# Patient Record
Sex: Female | Born: 1965 | Race: White | Hispanic: No | Smoking: Former smoker
Health system: Southern US, Community
[De-identification: ages and names within clinical notes are randomized; demographics above are authoritative.]

## PROBLEM LIST (undated history)

## (undated) DIAGNOSIS — J189 Pneumonia, unspecified organism: Secondary | ICD-10-CM

## (undated) DIAGNOSIS — F329 Major depressive disorder, single episode, unspecified: Secondary | ICD-10-CM

## (undated) DIAGNOSIS — G4730 Sleep apnea, unspecified: Secondary | ICD-10-CM

## (undated) DIAGNOSIS — R51 Headache: Secondary | ICD-10-CM

## (undated) DIAGNOSIS — M4800 Spinal stenosis, site unspecified: Secondary | ICD-10-CM

## (undated) DIAGNOSIS — M549 Dorsalgia, unspecified: Secondary | ICD-10-CM

## (undated) DIAGNOSIS — M1990 Unspecified osteoarthritis, unspecified site: Secondary | ICD-10-CM

## (undated) DIAGNOSIS — M5136 Other intervertebral disc degeneration, lumbar region: Secondary | ICD-10-CM

## (undated) DIAGNOSIS — R569 Unspecified convulsions: Secondary | ICD-10-CM

## (undated) DIAGNOSIS — F419 Anxiety disorder, unspecified: Secondary | ICD-10-CM

## (undated) DIAGNOSIS — R519 Headache, unspecified: Secondary | ICD-10-CM

## (undated) DIAGNOSIS — F32A Depression, unspecified: Secondary | ICD-10-CM

## (undated) HISTORY — PX: SHOULDER SURGERY: SHX246

## (undated) HISTORY — PX: COLONOSCOPY: SHX174

## (undated) HISTORY — PX: ANTERIOR CERVICAL DECOMP/DISCECTOMY FUSION: SHX1161

## (undated) HISTORY — PX: ABDOMINAL HYSTERECTOMY: SHX81

## (undated) HISTORY — PX: HERNIA REPAIR: SHX51

---

## 2014-12-12 ENCOUNTER — Emergency Department (HOSPITAL_COMMUNITY): Payer: Medicare Other

## 2014-12-12 ENCOUNTER — Encounter (HOSPITAL_COMMUNITY): Payer: Self-pay | Admitting: Nurse Practitioner

## 2014-12-12 ENCOUNTER — Emergency Department (HOSPITAL_COMMUNITY)
Admission: EM | Admit: 2014-12-12 | Discharge: 2014-12-12 | Disposition: A | Discharge status: Home / Self Care | Payer: Medicare Other | Attending: Emergency Medicine | Admitting: Emergency Medicine

## 2014-12-12 DIAGNOSIS — S8992XA Unspecified injury of left lower leg, initial encounter: Secondary | ICD-10-CM | POA: Diagnosis present

## 2014-12-12 DIAGNOSIS — Y9289 Other specified places as the place of occurrence of the external cause: Secondary | ICD-10-CM | POA: Diagnosis not present

## 2014-12-12 DIAGNOSIS — Y998 Other external cause status: Secondary | ICD-10-CM | POA: Diagnosis not present

## 2014-12-12 DIAGNOSIS — S8255XA Nondisplaced fracture of medial malleolus of left tibia, initial encounter for closed fracture: Secondary | ICD-10-CM | POA: Insufficient documentation

## 2014-12-12 DIAGNOSIS — X58XXXA Exposure to other specified factors, initial encounter: Secondary | ICD-10-CM | POA: Diagnosis not present

## 2014-12-12 DIAGNOSIS — Z8739 Personal history of other diseases of the musculoskeletal system and connective tissue: Secondary | ICD-10-CM | POA: Insufficient documentation

## 2014-12-12 DIAGNOSIS — S82202A Unspecified fracture of shaft of left tibia, initial encounter for closed fracture: Secondary | ICD-10-CM

## 2014-12-12 DIAGNOSIS — Y9389 Activity, other specified: Secondary | ICD-10-CM | POA: Diagnosis not present

## 2014-12-12 HISTORY — DX: Spinal stenosis, site unspecified: M48.00

## 2014-12-12 HISTORY — DX: Dorsalgia, unspecified: M54.9

## 2014-12-12 HISTORY — DX: Other intervertebral disc degeneration, lumbar region: M51.36

## 2014-12-12 HISTORY — DX: Unspecified osteoarthritis, unspecified site: M19.90

## 2014-12-12 MED ORDER — OXYCODONE-ACETAMINOPHEN 5-325 MG PO TABS: 1.0000 | ORAL_TABLET | Freq: Four times a day (QID) | ORAL | Status: DC | PRN

## 2014-12-12 MED ORDER — OXYCODONE-ACETAMINOPHEN 5-325 MG PO TABS
1.0000 | ORAL_TABLET | Freq: Once | ORAL | Status: AC
  Administered 2014-12-12: 1 via ORAL
  Filled 2014-12-12: qty 1
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
## 2014-12-12 NOTE — ED Notes (Signed)
She reports hx herniated disk with chronic lower back and L leg pain. She woke about 3 am to use the restroom and her L leg was painful and numb and she felt a pop in her L ankle and her pain has been worse since. She states she can not bear weight on the L ankle now

## 2014-12-12 NOTE — Discharge Instructions (Signed)
Ankle Fracture with Rehab Two bones in the ankle, the shinbone (tibia) and the bone of the outer ankle and lower leg (fibula), are susceptible to being fractured. The fracture may be a complete or an incomplete break of the bone. It is also common for the ligaments of the ankle joint to be injured at the same time as a fracture. SYMPTOMS   Severe pain in the ankle at the time of injury and/or when trying to move the ankle.  Feeling of popping or tearing in the inner or outer part of the ankle, sometimes as if the ankle joint was temporarily dislocated and popped back into place.  Cracking or other sounds may be heard at the time of fracture.  Severe tenderness in the ankle.  Swelling in the ankle and foot  Blisters around the ankle (uncommon).  Bleeding and bruising (contusion) in the ankle and foot.  Inability to stand or bear weight on the injured foot.  Visible deformity if the fracture is complete and the bone fragments separate enough to distort normal leg contours.  Numbness and coldness in the foot if the blood supply is impaired. CAUSES  Bones break when subjected to a force that is greater than the their strength. Most fractures are due to direct trauma, such as being hit with an object or falling.  Fractured may also be caused by indirect stress, such as twisting, pivoting, or violent muscle contraction . RISK INCREASES WITH:  Sports that require quick changes in direction (football, soccer, or skiing).  Sports that require jumping (basketball, volleyball, distance jumping, or high jumping).  Walking or running on uneven or rough surfaces.  Shoes with inadequate support to prevent the foot and ankle from rolling over when stress occurs.  Bony abnormalities (osteoporosis or bone tumors).  Metabolic disorders, hormone problems, and nutritional deficiencies and disorders.  Poor strength and flexibility  Previous ankle injury. PREVENTION   Warm up and stretch  properly before activity.  Maintain physical fitness:  Leg and ankle strength.  Flexibility and endurance.  Cardiovascular fitness.  Wear properly fitted protective equipment (high-top shoes or when appropriate and ankle bracing, taping, or splinting), especially for the first 12 months after an ankle injury. PROGNOSIS  If treated properly, ankle fractures typically heal well. RELATED COMPLICATIONS   Failure to heal (nonunion).  Healing in poor position (malunion).  Arrest of normal bone growth in children.  Proneness to repeated ankle injury.  Stiff ankle.  Unstable or arthritic ankle.  Infected skin blisters.  Prolonged healing time if activity is resumed too quickly. Risks of surgery, including infection, bleeding, injury to nerves (numbness, weakness, paralysis), and need for further surgery. TREATMENT  Treatment initially consists of ice and medication to help reduce pain and inflammation. The joint must be immobilized to allow for healing. If the fracture is where the bones are out of alignment (displaced), then surgery may be necessary to realign (reduce) them. Surgery usually involves placing pins and screws in the bones to hold them in place while the fracture heals. After surgery the joint is immobilized. Bone growth stimulators may be used to promote bone growth, but this is uncommon, Strengthening and stretching exercises are usually necessary after immobilization in order to regain strength and a full range of motion. These exercises may be completed at home or with a therapist. If pins and screws are placed in the bone, they are not usually removed unless they become a source of pain. MEDICATION   If pain medication is necessary,   then nonsteroidal anti-inflammatory medications, such as aspirin and ibuprofen, or other minor pain relievers, such as acetaminophen, are often recommended.  Do not take pain medication within 7 days before surgery.  Prescription pain  relievers may be prescribed if deemed necessary by your caregiver. Use only as directed and only as much as you need. SEEK MEDICAL CARE IF:   Symptoms get worse or do not improve in 2 weeks despite treatment.  The following occur after immobilization or surgery:  Swelling above or below the fracture site.  Severe, persistent pain.  Blue or gray skin below the fracture site, especially under the nails, or numbness or loss of feeling below the fracture site. Report any of these signs immediately.  New, unexplained symptoms develop (drugs used in treatment may produce side effects). EXERCISES  RANGE OF MOTION (ROM) AND STRETCHING EXERCISES - Ankle Fracture These exercises may help you when beginning to rehabilitate your injury. Your symptoms may resolve with or without further involvement from your physician, physical therapist or athletic trainer. While completing these exercises, remember:   Restoring tissue flexibility helps normal motion to return to the joints. This allows healthier, less painful movement and activity.  An effective stretch should be held for at least 30 seconds.  A stretch should never be painful. You should only feel a gentle lengthening or release in the stretched tissue. RANGE OF MOTION - Dorsi/Plantar Flexion  While sitting with your right / left knee straight, draw the top of your foot upwards by flexing your ankle. Then reverse the motion, pointing your toes downward.  Hold each position for __________ seconds.  After completing your first set of exercises, repeat this exercise with your knee bent. Repeat __________ times. Complete this exercise __________ times per day.  RANGE OF MOTION- Ankle Plantar Flexion   Sit with your right / left leg crossed over your opposite knee.  Use your opposite hand to pull the top of your foot and toes toward you.  You should feel a gentle stretch on the top of your foot/ankle. Hold this position for __________  seconds. Repeat __________ times. Complete __________ times per day.  RANGE OF MOTION - Ankle Eversion  Sit with your right / left ankle crossed over your opposite knee.  Grip your foot with your opposite hand, placing your thumb on the top of your foot and your fingers across the bottom of your foot.  Gently push your foot downward with a slight rotation so your littlest toes rise slightly  You should feel a gentle stretch on the inside of your ankle. Hold the stretch for __________ seconds. Repeat __________ times. Complete this exercise __________ times per day.  RANGE OF MOTION - Ankle Inversion  Sit with your right / left ankle crossed over your opposite knee.  Grip your foot with your opposite hand, placing your thumb on the bottom of your foot and your fingers across the top of your foot.  Gently pull your foot so the smallest toe comes toward you and your thumb pushes the inside of the ball of your foot away from you.  You should feel a gentle stretch on the outside of your ankle. Hold the stretch for __________ seconds. Repeat __________ times. Complete this exercise __________ times per day.  RANGE OF MOTION - Ankle Alphabet  Imagine your right / left big toe is a pen.  Keeping your hip and knee still, write out the entire alphabet with your "pen." Make the letters as large as you can   without increasing any discomfort. Repeat __________ times. Complete this exercise __________ times per day.  RANGE OF MOTION - Ankle Dorsiflexion, Active Assisted   Remove shoes and sit on a chair that is preferably not on a carpeted surface.  Place right / left foot under knee. Extend your opposite leg for support.  Keeping your heel down, slide your right / left foot back toward the chair until you feel a stretch at your ankle or calf. If you do not feel a stretch, slide your bottom forward to the edge of the chair, while still keeping your heel down.  Hold this stretch for __________  seconds. Repeat __________ times. Complete this stretch __________ times per day.  STRETCH - Gastrocsoleus   Sit with your right / left leg extended. Holding onto both ends of a belt or towel, loop it around the ball of your foot.  Keeping your right / left ankle and foot relaxed and your knee straight, pull your foot and ankle toward you using the belt/towel.  You should feel a gentle stretch behind your calf or knee. Hold this position for __________ seconds. Repeat __________ times. Complete this stretch __________ times per day.  STRENGTHENING EXERCISES - Ankle Fracture These exercises may help you when beginning to rehabilitate your injury. They may resolve your symptoms with or without further involvement from your physician, physical therapist or athletic trainer. While completing these exercises, remember:   Muscles can gain both the endurance and the strength needed for everyday activities through controlled exercises.  Complete these exercises as instructed by your physician, physical therapist or athletic trainer. Progress the resistance and repetitions only as guided.  You may experience muscle soreness or fatigue, but the pain or discomfort you are trying to eliminate should never worsen during these exercises. If this pain does worsen, stop and make certain you are following the directions exactly. If the pain is still present after adjustments, discontinue the exercise until you can discuss the trouble with your clinician. STRENGTH - Dorsiflexors  Secure a rubber exercise band/tubing to a fixed object (table, pole) and loop the other end around your right / left foot.  Sit on the floor facing the fixed object. The band/tubing should be slightly tense when your foot is relaxed.  Slowly draw your foot back toward you using your ankle and toes.  Hold this position for __________ seconds. Slowly release the tension in the band and return your foot to the starting  position. Repeat __________ times. Complete this exercise __________ times per day.  STRENGTH - Plantar-flexors  Sit with your right / left leg extended. Holding onto both ends of a rubber exercise band/tubing, loop it around the ball of your foot. Keep a slight tension in the band.  Slowly push your toes away from you, pointing them downward.  Hold this position for __________ seconds. Return slowly, controlling the tension in the band/tubing. Repeat __________ times. Complete this exercise __________ times per day.  STRENGTH - Ankle Eversion  Secure one end of a rubber exercise band/tubing to a fixed object (table, pole). Loop the other end around your foot just before your toes.  Place your fists between your knees. This will focus your strengthening at your ankle.  Drawing the band/tubing across your opposite foot, slowly, pull your little toe out and up. Make sure the band/tubing is positioned to resist the entire motion.  Hold this position for __________ seconds.  Have your muscles resist the band/tubing as it slowly pulls your foot   back to the starting position. Repeat __________ times. Complete this exercise __________ times per day.  STRENGTH - Ankle Inversion  Secure one end of a rubber exercise band/tubing to a fixed object (table, pole). Loop the other end around your foot just before your toes.  Place your fists between your knees. This will focus your strengthening at your ankle.  Slowly, pull your big toe up and in, making sure the band/tubing is positioned to resist the entire motion.  Hold this position for __________ seconds.  Have your muscles resist the band/tubing as it slowly pulls your foot back to the starting position. Repeat __________ times. Complete this exercises __________ times per day.  STRENGTH - Towel Curls  Sit in a chair positioned on a non-carpeted surface.  Place your foot on a towel, keeping your heel on the floor.  Pull the towel toward  your heel by only curling your toes. Keep your heel on the floor.  If instructed by your physician, physical therapist or athletic trainer, add weight at the end of the towel. Repeat __________ times. Complete this exercise __________ times per day. STRENGTH - Plantar-flexors, Standing   Stand with your feet shoulder width apart. Steady yourself with a wall or table using as little support as needed.  Keeping your weight evenly spread over the width of your feet, rise up on your toes.*  Hold this position for __________ seconds. Repeat __________ times. Complete this exercise __________ times per day.  *If this is too easy, shift your weight toward your right / left leg until you feel challenged. Ultimately, you may be asked to do this exercise with your right / left foot only. Document Released: 06/14/2005 Document Revised: 02/05/2012 Document Reviewed: 02/25/2009 [employer] Patient Information 2015 [employer]. This information is not intended to replace advice given to you by your health care provider. Make sure you discuss any questions you have with your health care provider.  

## 2018-04-07 ENCOUNTER — Emergency Department (HOSPITAL_COMMUNITY): Payer: Medicare Other

## 2018-04-07 ENCOUNTER — Encounter (HOSPITAL_COMMUNITY): Payer: Self-pay | Admitting: Emergency Medicine

## 2018-04-07 ENCOUNTER — Other Ambulatory Visit: Payer: Self-pay

## 2018-04-07 DIAGNOSIS — J020 Streptococcal pharyngitis: Secondary | ICD-10-CM | POA: Diagnosis not present

## 2018-04-07 DIAGNOSIS — J029 Acute pharyngitis, unspecified: Secondary | ICD-10-CM | POA: Diagnosis present

## 2018-04-07 DIAGNOSIS — Z79899 Other long term (current) drug therapy: Secondary | ICD-10-CM | POA: Diagnosis not present

## 2018-04-07 LAB — GROUP A STREP BY PCR: Group A Strep by PCR: DETECTED — AB

## 2018-04-07 MED ORDER — ALBUTEROL SULFATE (2.5 MG/3ML) 0.083% IN NEBU
5.0000 mg | INHALATION_SOLUTION | Freq: Once | RESPIRATORY_TRACT | Status: AC
  Administered 2018-04-07: 5 mg via RESPIRATORY_TRACT
  Filled 2018-04-07: qty 6

## 2018-04-07 NOTE — ED Triage Notes (Signed)
Pt reports having sore throat and coughing up yellow sputum. Pt reporting body aches and not being able to eat but has been able to drink fluids.

## 2018-04-08 ENCOUNTER — Emergency Department (HOSPITAL_COMMUNITY)
Admission: EM | Admit: 2018-04-08 | Discharge: 2018-04-08 | Disposition: A | Discharge status: Home / Self Care | Payer: Medicare Other | Attending: Emergency Medicine | Admitting: Emergency Medicine

## 2018-04-08 DIAGNOSIS — J020 Streptococcal pharyngitis: Secondary | ICD-10-CM | POA: Diagnosis not present

## 2018-04-08 HISTORY — DX: Unspecified convulsions: R56.9

## 2018-04-08 MED ORDER — PREDNISONE 20 MG PO TABS
60.0000 mg | ORAL_TABLET | Freq: Once | ORAL | Status: AC
  Administered 2018-04-08: 60 mg via ORAL
  Filled 2018-04-08: qty 3

## 2018-04-08 MED ORDER — PENICILLIN G BENZATHINE 1200000 UNIT/2ML IM SUSP
1.2000 10*6.[IU] | Freq: Once | INTRAMUSCULAR | Status: AC
  Administered 2018-04-08: 1.2 10*6.[IU] via INTRAMUSCULAR
  Filled 2018-04-08: qty 2

## 2018-04-08 NOTE — ED Provider Notes (Signed)
[facility]-EMERGENCY DEPT Provider Note   CSN: [id number] Arrival date & time: 04/07/18  2014     History   Chief Complaint Chief Complaint  Patient presents with  . Sore Throat  . Cough    HPI Autumn Estrada is a 52 y.o. female w PMHx seizure d/o, presenting to the ED with 7 days of sore throat. She states she lives her daughter and son-in-law, who both had strep throat this week.  He states pain is worse with swallowing.  Has had difficulty eating solids, however has been drinking lots of fluids.  Reports associated nasal congestion, ear pain, and generalized myalgias.  States about 3 days ago she developed a cough that is productive of yellow sputum.  Denies fever, difficulty breathing, or other complaints.  The history is provided by the patient.    Past Medical History:  Diagnosis Date  . Arthritis   . Back pain   . Degenerative disc disease, lumbar   . Seizures (HCC)   . Spinal stenosis     There are no active problems to display for this patient.   History reviewed. No pertinent surgical history.   OB History   None      Home Medications    Prior to Admission medications   Medication Sig Start Date End Date Taking? Authorizing Provider  oxyCODONE-acetaminophen (PERCOCET/ROXICET) 5-325 MG per tablet Take 1 tablet by mouth every 6 (six) hours as needed for severe pain. 12/12/14   Fayrene Helper, PA-C    Family History History reviewed. No pertinent family history.  Social History Social History   Tobacco Use  . Smoking status: Never Smoker  . Smokeless tobacco: Never Used  Substance Use Topics  . Alcohol use: No  . Drug use: No     Allergies   Patient has no known allergies.   Review of Systems Review of Systems  Constitutional: Negative for fever.  HENT: Positive for congestion, ear pain and sore throat. Negative for ear discharge, trouble swallowing and voice change.   Respiratory: Positive for cough. Negative for  shortness of breath.      Physical Exam Updated Vital Signs BP 110/60 (BP Location: Right Arm)   Pulse 83   Temp 98.7 F (37.1 C) (Oral)   Resp 16   Ht  (1.702 m)   Wt 71.5 kg (157 lb 9.6 oz)   SpO2 95%   BMI 24.68 kg/m   Physical Exam  Constitutional: She appears well-developed and well-nourished. No distress.  Tolerating secretions.  HENT:  Head: Normocephalic and atraumatic.  Right Ear: Tympanic membrane and ear canal normal.  Left Ear: Tympanic membrane and ear canal normal.  Mouth/Throat: Uvula is midline. No trismus in the jaw. No uvula swelling. Posterior oropharyngeal edema (mild) and posterior oropharyngeal erythema present. No tonsillar abscesses. No tonsillar exudate.  Nose is congested  Eyes: Conjunctivae are normal.  Neck: Normal range of motion. Neck supple.  Cardiovascular: Normal rate, regular rhythm and intact distal pulses.  Pulmonary/Chest: Effort normal and breath sounds normal. No stridor. No respiratory distress. She has no wheezes. She has no rales.  Lymphadenopathy:    She has cervical adenopathy (b/l anterior).  Psychiatric: She has a normal mood and affect. Her behavior is normal.  Nursing note and vitals reviewed.    ED Treatments / Results  Labs (all labs ordered are listed, but only abnormal results are displayed) Labs Reviewed  GROUP A STREP BY PCR - Abnormal; Notable for the following components:  Result Value   Group A Strep by PCR DETECTED (*)    All other components within normal limits    EKG EKG Interpretation  Date/Time:  Sunday Apr 07 2018 21:41:10 EDT Ventricular Rate:  78 PR Interval:    QRS Duration: 78 QT Interval:  403 QTC Calculation: 459 R Axis:   88 Text Interpretation:  Sinus rhythm Consider left atrial enlargement Baseline wander Otherwise within normal limits No old tracing to compare Confirmed by Devoria Albe ([id number]) on 04/08/2018 2:11:49 AM   Radiology Dg Chest 2 View  Result Date:  04/07/2018 CLINICAL DATA:  Acute onset of sore throat and productive cough. Generalized body aches. EXAM: CHEST - 2 VIEW COMPARISON:  None. FINDINGS: The lungs are well-aerated and clear. There is no evidence of focal opacification, pleural effusion or pneumothorax. The heart is normal in size; the mediastinal contour is within normal limits. No acute osseous abnormalities are seen. Cervical spinal fusion hardware is noted. The patient is status post left-sided rotator cuff repair. IMPRESSION: No acute cardiopulmonary process seen. Electronically Signed   By: Roanna Raider M.D.   On: 04/07/2018 22:43    Procedures Procedures (including critical care time)  Medications Ordered in ED Medications  albuterol (PROVENTIL) (2.5 MG/3ML) 0.083% nebulizer solution 5 mg (5 mg Nebulization Given 04/07/18 2228)  penicillin g benzathine (BICILLIN LA) 1200000 UNIT/2ML injection 1.2 Million Units (1.2 Million Units Intramuscular Given 04/08/18 0238)  predniSONE (DELTASONE) tablet 60 mg (60 mg Oral Given 04/08/18 0238)     Initial Impression / Assessment and Plan / ED Course  I have reviewed the triage vital signs and the nursing notes.  Pertinent labs & imaging results that were available during my care of the patient were reviewed by me and considered in my medical decision making (see chart for details).     Pt w sore throat x 1 week, diagnosis of strep pharyngitis. Afebrile, tolerating secretions, VSS. Lungs CTAB. CXR done in triage is neg. Positive strep PCR. Pt treated with steroids and PCN IM.  Discussed importance of water rehydration. Presentation non concerning for PTA or infxn spread to soft tissue. No trismus or uvula deviation. Specific return precautions discussed. Pt able to drink water in ED without difficulty with intact air way. Recommended PCP follow up.   Discussed results, findings, treatment and follow up. Patient advised of return precautions. Patient verbalized understanding and agreed  with plan.  Final Clinical Impressions(s) / ED Diagnoses   Final diagnoses:  Strep pharyngitis    ED Discharge Orders    None       Robinson, [place] N, PA-C 04/08/18 0247    Devoria Albe, MD 04/08/18 [phone]

## 2018-04-08 NOTE — Discharge Instructions (Addendum)
Please read instructions below.  You have been treated with penicillin for strep throat. You can take tylenol or ibuprofen as needed for sore throat or fever.  Drink plenty of water.  Use saline nasal spray for congestion. Follow up with your primary care provider or urgent care if symptoms don't improve. Return to the ER for inability to swallow liquids, difficulty breathing, or new or worsening symptoms.

## 2019-01-08 NOTE — H&P (Signed)
HISTORY AND PHYSICAL  Autumn Estrada is a 53 y.o. female patient with CC: painful teeth. Referred by general dentist for full mouth extractions.  No diagnosis found.  Past Medical History:  Diagnosis Date  . Arthritis   . Back pain   . Degenerative disc disease, lumbar   . Seizures (HCC)   . Spinal stenosis     No current facility-administered medications for this encounter.    Current Outpatient Medications  Medication Sig Dispense Refill  . ALPRAZolam (XANAX) 1 MG tablet Take 1.5 mg by mouth 3 (three) times daily.    . Aspirin-Acetaminophen-Caffeine (GOODY HEADACHE PO) Take 1-2 packets by mouth 2 (two) times daily as needed (migraines).    . carisoprodol (SOMA) 350 MG tablet Take 350 mg by mouth 3 (three) times daily.    Marland Kitchen gabapentin (NEURONTIN) 600 MG tablet Take 1,200 mg by mouth 3 (three) times daily.    Marland Kitchen lamoTRIgine (LAMICTAL) 200 MG tablet Take 200 mg by mouth 2 (two) times daily.    Marland Kitchen levocetirizine (XYZAL) 5 MG tablet Take 5 mg by mouth at bedtime.    . ondansetron (ZOFRAN-ODT) 4 MG disintegrating tablet Take 4 mg by mouth 3 (three) times daily.    . sertraline (ZOLOFT) 100 MG tablet Take 200 mg by mouth daily.    . traZODone (DESYREL) 100 MG tablet Take 200 mg by mouth at bedtime.     Allergies  Allergen Reactions  . Pregabalin Other (See Comments)    agitation   Active Problems:   * No active hospital problems. *  Vitals: There were no vitals taken for this visit. Lab results:No results found for this or any previous visit (from the past 24 hour(s)). Radiology Results: No results found. General appearance: alert, cooperative and no distress Head: Normocephalic, without obvious abnormality, atraumatic Eyes: negative Nose: Nares normal. Septum midline. Mucosa normal. No drainage or sinus tenderness. Throat: multiple dental caries, root exposure, periodontitis. Bilateral mandibular lingual tori. Pharynx clear. Neck: no adenopathy, supple, symmetrical, trachea  midline and thyroid not enlarged, symmetric, no tenderness/mass/nodules Resp: clear to auscultation bilaterally Cardio: regular rate and rhythm, S1, S2 normal, no murmur, click, rub or gallop  Assessment: All teeth non-restorable secondary to dental caries, periodontitis. Bilateral mandibular lingual tori.  Plan:Full mouth extractions with alveoloplasty, tori removal GA. 23h obs.   Ocie Doyne 01/08/2019

## 2019-01-09 ENCOUNTER — Other Ambulatory Visit: Payer: Self-pay

## 2019-01-09 ENCOUNTER — Encounter (HOSPITAL_COMMUNITY): Payer: Self-pay | Admitting: *Deleted

## 2019-01-09 NOTE — Anesthesia Preprocedure Evaluation (Addendum)
Anesthesia Evaluation  Patient identified by MRN, date of birth, ID band Patient awake    Reviewed: Allergy & Precautions, NPO status , Patient's Chart, lab work & pertinent test results  History of Anesthesia Complications Negative for: history of anesthetic complications  Airway Mallampati: II  TM Distance: >3 FB Neck ROM: Full    Dental  (+) Dental Advisory Given, Poor Dentition   Pulmonary sleep apnea , former smoker,    Pulmonary exam normal breath sounds clear to auscultation       Cardiovascular negative cardio ROS Normal cardiovascular exam Rhythm:Regular Rate:Normal     Neuro/Psych Seizures - (last seizure approx 4 months ago), Well Controlled,  Anxiety Depression    GI/Hepatic negative GI ROS, Neg liver ROS,   Endo/Other  negative endocrine ROS  Renal/GU negative Renal ROS     Musculoskeletal  (+) Arthritis ,   Abdominal   Peds  Hematology negative hematology ROS (+)   Anesthesia Other Findings Day of surgery medications reviewed with the patient.  Reproductive/Obstetrics                            Anesthesia Physical Anesthesia Plan  ASA: II  Anesthesia Plan: General   Post-op Pain Management:    Induction: Intravenous  PONV Risk Score and Plan: 3 and Treatment may vary due to age or medical condition, Ondansetron, Dexamethasone and Midazolam  Airway Management Planned: Nasal ETT  Additional Equipment:   Intra-op Plan:   Post-operative Plan: Extubation in OR  Informed Consent: I have reviewed the patients History and Physical, chart, labs and discussed the procedure including the risks, benefits and alternatives for the proposed anesthesia with the patient or authorized representative who has indicated his/her understanding and acceptance.     Dental advisory given  Plan Discussed with: CRNA  Anesthesia Plan Comments:        Anesthesia Quick  Evaluation

## 2019-01-09 NOTE — Progress Notes (Signed)
Spoke with pt for pre-op call. Pt denies cardiac history, HTN or Diabetes. Pt does have hx of seizures, none for 3 months.

## 2019-01-10 ENCOUNTER — Observation Stay (HOSPITAL_COMMUNITY)
Admission: RE | Admit: 2019-01-10 | Discharge: 2019-01-11 | Disposition: A | Discharge status: Home / Self Care | Payer: Medicare Other | Attending: Oral Surgery | Admitting: Oral Surgery

## 2019-01-10 ENCOUNTER — Ambulatory Visit (HOSPITAL_COMMUNITY): Payer: Medicare Other | Admitting: Certified Registered Nurse Anesthetist

## 2019-01-10 ENCOUNTER — Encounter (HOSPITAL_COMMUNITY): Payer: Self-pay

## 2019-01-10 ENCOUNTER — Encounter (HOSPITAL_COMMUNITY)
Admission: RE | Disposition: A | Discharge status: Home / Self Care | Payer: Self-pay | Source: Home / Self Care | Attending: Oral Surgery

## 2019-01-10 ENCOUNTER — Other Ambulatory Visit: Payer: Self-pay

## 2019-01-10 DIAGNOSIS — R569 Unspecified convulsions: Secondary | ICD-10-CM | POA: Diagnosis not present

## 2019-01-10 DIAGNOSIS — K029 Dental caries, unspecified: Principal | ICD-10-CM | POA: Insufficient documentation

## 2019-01-10 DIAGNOSIS — Z87891 Personal history of nicotine dependence: Secondary | ICD-10-CM | POA: Diagnosis not present

## 2019-01-10 DIAGNOSIS — Z79899 Other long term (current) drug therapy: Secondary | ICD-10-CM | POA: Diagnosis not present

## 2019-01-10 DIAGNOSIS — Z888 Allergy status to other drugs, medicaments and biological substances status: Secondary | ICD-10-CM | POA: Insufficient documentation

## 2019-01-10 DIAGNOSIS — Z9889 Other specified postprocedural states: Secondary | ICD-10-CM

## 2019-01-10 DIAGNOSIS — G4730 Sleep apnea, unspecified: Secondary | ICD-10-CM | POA: Insufficient documentation

## 2019-01-10 DIAGNOSIS — K0530 Chronic periodontitis, unspecified: Secondary | ICD-10-CM | POA: Diagnosis not present

## 2019-01-10 DIAGNOSIS — M270 Developmental disorders of jaws: Secondary | ICD-10-CM | POA: Diagnosis not present

## 2019-01-10 DIAGNOSIS — Z7982 Long term (current) use of aspirin: Secondary | ICD-10-CM | POA: Insufficient documentation

## 2019-01-10 DIAGNOSIS — F419 Anxiety disorder, unspecified: Secondary | ICD-10-CM | POA: Diagnosis not present

## 2019-01-10 DIAGNOSIS — F329 Major depressive disorder, single episode, unspecified: Secondary | ICD-10-CM | POA: Diagnosis not present

## 2019-01-10 DIAGNOSIS — Z791 Long term (current) use of non-steroidal anti-inflammatories (NSAID): Secondary | ICD-10-CM | POA: Insufficient documentation

## 2019-01-10 DIAGNOSIS — M5136 Other intervertebral disc degeneration, lumbar region: Secondary | ICD-10-CM | POA: Diagnosis not present

## 2019-01-10 DIAGNOSIS — M1990 Unspecified osteoarthritis, unspecified site: Secondary | ICD-10-CM | POA: Insufficient documentation

## 2019-01-10 HISTORY — DX: Pneumonia, unspecified organism: J18.9

## 2019-01-10 HISTORY — DX: Sleep apnea, unspecified: G47.30

## 2019-01-10 HISTORY — DX: Anxiety disorder, unspecified: F41.9

## 2019-01-10 HISTORY — DX: Depression, unspecified: F32.A

## 2019-01-10 HISTORY — DX: Major depressive disorder, single episode, unspecified: F32.9

## 2019-01-10 HISTORY — PX: TOOTH EXTRACTION: SHX859

## 2019-01-10 HISTORY — DX: Headache, unspecified: R51.9

## 2019-01-10 HISTORY — DX: Headache: R51

## 2019-01-10 LAB — BASIC METABOLIC PANEL
ANION GAP: 9 (ref 5–15)
BUN: 18 mg/dL (ref 6–20)
CO2: 25 mmol/L (ref 22–32)
Calcium: 9 mg/dL (ref 8.9–10.3)
Chloride: 106 mmol/L (ref 98–111)
Creatinine, Ser: 0.75 mg/dL (ref 0.44–1.00)
GFR calc Af Amer: >60 mL/min (ref >60)
GFR calc non Af Amer: >60 mL/min (ref >60)
Glucose, Bld: 96 mg/dL (ref 70–99)
Potassium: 4.2 mmol/L (ref 3.5–5.1)
Sodium: 140 mmol/L (ref 135–145)

## 2019-01-10 LAB — CBC
HCT: 37.6 % (ref 36.0–46.0)
Hemoglobin: 11.6 g/dL — ABNORMAL LOW (ref 12.0–15.0)
MCH: 30.6 pg (ref 26.0–34.0)
MCHC: 30.9 g/dL (ref 30.0–36.0)
MCV: 99.2 fL (ref 80.0–100.0)
NRBC: 0 % (ref 0.0–0.2)
Platelets: 273 10*3/uL (ref 150–400)
RBC: 3.79 MIL/uL — ABNORMAL LOW (ref 3.87–5.11)
RDW: 12.4 % (ref 11.5–15.5)
WBC: 6.1 10*3/uL (ref 4.0–10.5)

## 2019-01-10 SURGERY — DENTAL RESTORATION/EXTRACTIONS
Anesthesia: General | Site: Mouth

## 2019-01-10 MED ORDER — DIPHENHYDRAMINE HCL 25 MG PO CAPS
25.0000 mg | ORAL_CAPSULE | Freq: Four times a day (QID) | ORAL | Status: DC | PRN
  Administered 2019-01-11: 25 mg via ORAL
  Filled 2019-01-10: qty 1

## 2019-01-10 MED ORDER — METOPROLOL TARTRATE 5 MG/5ML IV SOLN: 5.0000 mg | Freq: Four times a day (QID) | INTRAVENOUS | Status: DC | PRN

## 2019-01-10 MED ORDER — LACTATED RINGERS IV SOLN
INTRAVENOUS | Status: DC | PRN
  Administered 2019-01-10: 07:00:00 via INTRAVENOUS

## 2019-01-10 MED ORDER — ONDANSETRON 4 MG PO TBDP
4.0000 mg | ORAL_TABLET | Freq: Three times a day (TID) | ORAL | Status: DC | PRN
  Administered 2019-01-10 – 2019-01-11 (×3): 4 mg via ORAL
  Filled 2019-01-10 (×3): qty 1

## 2019-01-10 MED ORDER — ROCURONIUM BROMIDE 100 MG/10ML IV SOLN
INTRAVENOUS | Status: DC | PRN
  Administered 2019-01-10: 50 mg via INTRAVENOUS

## 2019-01-10 MED ORDER — ROCURONIUM BROMIDE 50 MG/5ML IV SOSY
PREFILLED_SYRINGE | INTRAVENOUS | Status: AC
  Filled 2019-01-10: qty 5

## 2019-01-10 MED ORDER — LORATADINE 10 MG PO TABS
10.0000 mg | ORAL_TABLET | Freq: Every day | ORAL | Status: DC | PRN
  Administered 2019-01-10: 10 mg via ORAL
  Filled 2019-01-10: qty 1

## 2019-01-10 MED ORDER — SODIUM CHLORIDE 0.9 % IR SOLN
Status: DC | PRN
  Administered 2019-01-10: 1000 mL

## 2019-01-10 MED ORDER — DEXAMETHASONE SODIUM PHOSPHATE 10 MG/ML IJ SOLN
INTRAMUSCULAR | Status: DC | PRN
  Administered 2019-01-10: 10 mg via INTRAVENOUS

## 2019-01-10 MED ORDER — CEFAZOLIN SODIUM-DEXTROSE 2-4 GM/100ML-% IV SOLN
2.0000 g | INTRAVENOUS | Status: AC
  Administered 2019-01-10: 2 g via INTRAVENOUS
  Filled 2019-01-10: qty 100

## 2019-01-10 MED ORDER — DEXTROSE-NACL 5-0.45 % IV SOLN
INTRAVENOUS | Status: DC
  Administered 2019-01-10 – 2019-01-11 (×2): via INTRAVENOUS

## 2019-01-10 MED ORDER — ACETAMINOPHEN 500 MG PO TABS
1000.0000 mg | ORAL_TABLET | Freq: Four times a day (QID) | ORAL | Status: DC
  Administered 2019-01-10 – 2019-01-11 (×4): 1000 mg via ORAL
  Filled 2019-01-10 (×4): qty 2

## 2019-01-10 MED ORDER — WHITE PETROLATUM EX OINT
TOPICAL_OINTMENT | CUTANEOUS | Status: AC
  Filled 2019-01-10: qty 28.35

## 2019-01-10 MED ORDER — EPHEDRINE 5 MG/ML INJ
INTRAVENOUS | Status: AC
  Filled 2019-01-10: qty 10

## 2019-01-10 MED ORDER — PROMETHAZINE HCL 25 MG/ML IJ SOLN: 6.2500 mg | INTRAMUSCULAR | Status: DC | PRN

## 2019-01-10 MED ORDER — LIDOCAINE 2% (20 MG/ML) 5 ML SYRINGE
INTRAMUSCULAR | Status: AC
  Filled 2019-01-10: qty 5

## 2019-01-10 MED ORDER — OXYMETAZOLINE HCL 0.05 % NA SOLN
NASAL | Status: DC | PRN
  Administered 2019-01-10: 2 via NASAL

## 2019-01-10 MED ORDER — 0.9 % SODIUM CHLORIDE (POUR BTL) OPTIME
TOPICAL | Status: DC | PRN
  Administered 2019-01-10: 1000 mL

## 2019-01-10 MED ORDER — OXYMETAZOLINE HCL 0.05 % NA SOLN
NASAL | Status: DC | PRN
  Administered 2019-01-10: 1

## 2019-01-10 MED ORDER — ONDANSETRON HCL 4 MG/2ML IJ SOLN
INTRAMUSCULAR | Status: DC | PRN
  Administered 2019-01-10: 4 mg via INTRAVENOUS

## 2019-01-10 MED ORDER — FENTANYL CITRATE (PF) 250 MCG/5ML IJ SOLN
INTRAMUSCULAR | Status: AC
  Filled 2019-01-10: qty 5

## 2019-01-10 MED ORDER — PROPOFOL 10 MG/ML IV BOLUS
INTRAVENOUS | Status: DC | PRN
  Administered 2019-01-10: 150 mg via INTRAVENOUS

## 2019-01-10 MED ORDER — HEMOSTATIC AGENTS (NO CHARGE) OPTIME
TOPICAL | Status: DC | PRN
  Administered 2019-01-10: 1 via TOPICAL

## 2019-01-10 MED ORDER — PROPOFOL 10 MG/ML IV BOLUS
INTRAVENOUS | Status: AC
  Filled 2019-01-10: qty 20

## 2019-01-10 MED ORDER — SERTRALINE HCL 100 MG PO TABS
200.0000 mg | ORAL_TABLET | Freq: Every day | ORAL | Status: DC
  Administered 2019-01-11: 200 mg via ORAL
  Filled 2019-01-10: qty 4

## 2019-01-10 MED ORDER — SUGAMMADEX SODIUM 200 MG/2ML IV SOLN
INTRAVENOUS | Status: DC | PRN
  Administered 2019-01-10: 175 mg via INTRAVENOUS

## 2019-01-10 MED ORDER — LIDOCAINE-EPINEPHRINE 2 %-1:100000 IJ SOLN
INTRAMUSCULAR | Status: AC
  Filled 2019-01-10: qty 4

## 2019-01-10 MED ORDER — PHENYLEPHRINE 40 MCG/ML (10ML) SYRINGE FOR IV PUSH (FOR BLOOD PRESSURE SUPPORT)
PREFILLED_SYRINGE | INTRAVENOUS | Status: AC
  Filled 2019-01-10: qty 10

## 2019-01-10 MED ORDER — OXYMETAZOLINE HCL 0.05 % NA SOLN
NASAL | Status: AC
  Filled 2019-01-10: qty 30

## 2019-01-10 MED ORDER — OXYCODONE HCL 5 MG PO TABS: 5.0000 mg | ORAL_TABLET | Freq: Once | ORAL | Status: DC | PRN

## 2019-01-10 MED ORDER — ONDANSETRON HCL 4 MG/2ML IJ SOLN
INTRAMUSCULAR | Status: AC
  Filled 2019-01-10: qty 2

## 2019-01-10 MED ORDER — LIDOCAINE HCL (CARDIAC) PF 100 MG/5ML IV SOSY
PREFILLED_SYRINGE | INTRAVENOUS | Status: DC | PRN
  Administered 2019-01-10: 60 mg via INTRAVENOUS

## 2019-01-10 MED ORDER — GABAPENTIN 600 MG PO TABS
1200.0000 mg | ORAL_TABLET | Freq: Three times a day (TID) | ORAL | Status: DC
  Administered 2019-01-10 – 2019-01-11 (×3): 1200 mg via ORAL
  Filled 2019-01-10 (×3): qty 2

## 2019-01-10 MED ORDER — ACETAMINOPHEN 10 MG/ML IV SOLN: 1000.0000 mg | Freq: Once | INTRAVENOUS | Status: DC | PRN

## 2019-01-10 MED ORDER — FENTANYL CITRATE (PF) 100 MCG/2ML IJ SOLN: 25.0000 ug | INTRAMUSCULAR | Status: DC | PRN

## 2019-01-10 MED ORDER — OXYCODONE HCL 5 MG PO TABS
5.0000 mg | ORAL_TABLET | ORAL | Status: DC | PRN
  Administered 2019-01-10 (×2): 10 mg via ORAL
  Administered 2019-01-10: 5 mg via ORAL
  Administered 2019-01-11: 10 mg via ORAL
  Filled 2019-01-10: qty 2
  Filled 2019-01-10 (×2): qty 1
  Filled 2019-01-10 (×2): qty 2

## 2019-01-10 MED ORDER — CARISOPRODOL 350 MG PO TABS
350.0000 mg | ORAL_TABLET | Freq: Three times a day (TID) | ORAL | Status: DC
  Administered 2019-01-10 – 2019-01-11 (×3): 350 mg via ORAL
  Filled 2019-01-10 (×3): qty 1

## 2019-01-10 MED ORDER — DEXAMETHASONE SODIUM PHOSPHATE 10 MG/ML IJ SOLN
INTRAMUSCULAR | Status: AC
  Filled 2019-01-10: qty 2

## 2019-01-10 MED ORDER — LAMOTRIGINE 100 MG PO TABS
200.0000 mg | ORAL_TABLET | Freq: Two times a day (BID) | ORAL | Status: DC
  Administered 2019-01-10 – 2019-01-11 (×2): 200 mg via ORAL
  Filled 2019-01-10 (×2): qty 2

## 2019-01-10 MED ORDER — FENTANYL CITRATE (PF) 250 MCG/5ML IJ SOLN
INTRAMUSCULAR | Status: DC | PRN
  Administered 2019-01-10: 100 ug via INTRAVENOUS
  Administered 2019-01-10 (×2): 50 ug via INTRAVENOUS

## 2019-01-10 MED ORDER — TRAZODONE HCL 100 MG PO TABS
200.0000 mg | ORAL_TABLET | Freq: Every day | ORAL | Status: DC
  Administered 2019-01-10: 200 mg via ORAL
  Filled 2019-01-10: qty 2

## 2019-01-10 MED ORDER — MIDAZOLAM HCL 2 MG/2ML IJ SOLN
INTRAMUSCULAR | Status: AC
  Filled 2019-01-10: qty 2

## 2019-01-10 MED ORDER — ALPRAZOLAM 0.5 MG PO TABS
1.5000 mg | ORAL_TABLET | Freq: Three times a day (TID) | ORAL | Status: DC
  Administered 2019-01-10 – 2019-01-11 (×3): 1.5 mg via ORAL
  Filled 2019-01-10 (×3): qty 3

## 2019-01-10 MED ORDER — LIDOCAINE-EPINEPHRINE 2 %-1:100000 IJ SOLN
INTRAMUSCULAR | Status: DC | PRN
  Administered 2019-01-10: 20 mL via INTRADERMAL

## 2019-01-10 MED ORDER — MIDAZOLAM HCL 5 MG/5ML IJ SOLN
INTRAMUSCULAR | Status: DC | PRN
  Administered 2019-01-10: 2 mg via INTRAVENOUS

## 2019-01-10 MED ORDER — HYDROMORPHONE HCL 1 MG/ML IJ SOLN
0.5000 mg | INTRAMUSCULAR | Status: DC | PRN
  Administered 2019-01-10 – 2019-01-11 (×5): 0.5 mg via INTRAVENOUS
  Filled 2019-01-10 (×5): qty 1

## 2019-01-10 MED ORDER — SUCCINYLCHOLINE CHLORIDE 200 MG/10ML IV SOSY
PREFILLED_SYRINGE | INTRAVENOUS | Status: AC
  Filled 2019-01-10: qty 10

## 2019-01-10 MED ORDER — OXYCODONE HCL 5 MG/5ML PO SOLN: 5.0000 mg | Freq: Once | ORAL | Status: DC | PRN

## 2019-01-10 MED ORDER — DIPHENHYDRAMINE HCL 50 MG/ML IJ SOLN: 25.0000 mg | Freq: Four times a day (QID) | INTRAMUSCULAR | Status: DC | PRN

## 2019-01-10 SURGICAL SUPPLY — 39 items
BLADE SURG 15 STRL LF DISP TIS (BLADE) ×1 IMPLANT
BLADE SURG 15 STRL SS (BLADE)
BUR CROSS CUT FISSURE 1.6 (BURR) ×2 IMPLANT
BUR CROSS CUT FISSURE 1.6MM (BURR) ×1
BUR EGG ELITE 4.0 (BURR) ×2 IMPLANT
BUR EGG ELITE 4.0MM (BURR) ×1
CANISTER SUCT 3000ML PPV (MISCELLANEOUS) ×3 IMPLANT
COVER SURGICAL LIGHT HANDLE (MISCELLANEOUS) ×3 IMPLANT
COVER WAND RF STERILE (DRAPES) ×3 IMPLANT
DECANTER SPIKE VIAL GLASS SM (MISCELLANEOUS) ×3 IMPLANT
DRAPE U-SHAPE 76X120 STRL (DRAPES) ×3 IMPLANT
GAUZE PACKING FOLDED 2  STR (GAUZE/BANDAGES/DRESSINGS) ×2
GAUZE PACKING FOLDED 2 STR (GAUZE/BANDAGES/DRESSINGS) ×1 IMPLANT
GLOVE BIO SURGEON STRL SZ 6.5 (GLOVE) IMPLANT
GLOVE BIO SURGEON STRL SZ7 (GLOVE) IMPLANT
GLOVE BIO SURGEON STRL SZ7.5 (GLOVE) ×3 IMPLANT
GLOVE BIO SURGEONS STRL SZ 6.5 (GLOVE)
GLOVE BIOGEL PI IND STRL 6.5 (GLOVE) IMPLANT
GLOVE BIOGEL PI IND STRL 7.0 (GLOVE) IMPLANT
GLOVE BIOGEL PI INDICATOR 6.5 (GLOVE)
GLOVE BIOGEL PI INDICATOR 7.0 (GLOVE)
GOWN STRL REUS W/ TWL LRG LVL3 (GOWN DISPOSABLE) ×1 IMPLANT
GOWN STRL REUS W/ TWL XL LVL3 (GOWN DISPOSABLE) ×1 IMPLANT
GOWN STRL REUS W/TWL LRG LVL3 (GOWN DISPOSABLE) ×2
GOWN STRL REUS W/TWL XL LVL3 (GOWN DISPOSABLE) ×2
IV NS 1000ML (IV SOLUTION) ×2
IV NS 1000ML BAXH (IV SOLUTION) ×1 IMPLANT
KIT BASIN OR (CUSTOM PROCEDURE TRAY) ×3 IMPLANT
KIT TURNOVER KIT B (KITS) ×3 IMPLANT
NEEDLE 22X1 1/2 (OR ONLY) (NEEDLE) ×6 IMPLANT
NS IRRIG 1000ML POUR BTL (IV SOLUTION) ×3 IMPLANT
PAD ARMBOARD 7.5X6 YLW CONV (MISCELLANEOUS) ×3 IMPLANT
SLEEVE IRRIGATION ELITE 7 (MISCELLANEOUS) ×3 IMPLANT
SPONGE SURGIFOAM ABS GEL 12-7 (HEMOSTASIS) ×2 IMPLANT
SUT CHROMIC 3 0 PS 2 (SUTURE) ×5 IMPLANT
SYR CONTROL 10ML LL (SYRINGE) ×3 IMPLANT
TRAY ENT MC OR (CUSTOM PROCEDURE TRAY) ×3 IMPLANT
TUBING IRRIGATION (MISCELLANEOUS) ×3 IMPLANT
YANKAUER SUCT BULB TIP NO VENT (SUCTIONS) ×3 IMPLANT

## 2019-01-10 NOTE — Transfer of Care (Signed)
Immediate Anesthesia Transfer of Care Note  Patient: Autumn Estrada  Procedure(s) Performed: EXTRACTION TEETH NUMBERS TWO, THREE, FOUR, FIVE, SIX, SEVEN, EIGHT, NINE, TEN, ELEVEN, TWELVE, THIRTEEN, FOURTEEN, FIFTEEN, SIXTEEN, SEVENTEEN, EIGHTEEN, TWENTY, TWENTY ONE, TWENTY TWO, TWENTY THREE, TWENTY FOUR, TWENTY FIVE, TWENTY SIX, TWENTY SEVEN, TWENTY EIGHT, TWENTY NINE, THIRTY, THIRTY ONE WITH AVEOLOPLASTY  AND REMOVAL BILATERAL MANDIBULAR LINGUAL TORI (N/A Mouth)  Patient Location: PACU  Anesthesia Type:General  Level of Consciousness: awake, alert , oriented and patient cooperative  Airway & Oxygen Therapy: Patient Spontanous Breathing and Patient connected to nasal cannula oxygen  Post-op Assessment: Report given to RN and Post -op Vital signs reviewed and stable  Post vital signs: Reviewed and stable  Last Vitals:  Vitals Value Taken Time  BP 76/58 01/10/2019  8:43 AM  Temp    Pulse 104 01/10/2019  8:43 AM  Resp 12 01/10/2019  8:43 AM  SpO2 96 % 01/10/2019  8:43 AM  Vitals shown include unvalidated device data.  Last Pain:  Vitals:   01/10/19 0631  TempSrc:   PainSc: 8       Patients Stated Pain Goal: 2 (01/10/19 0631)  Complications: No apparent anesthesia complications

## 2019-01-10 NOTE — Anesthesia Postprocedure Evaluation (Signed)
Anesthesia Post Note  Patient: Autumn Estrada  Procedure(s) Performed: EXTRACTION TEETH NUMBERS TWO, THREE, FOUR, FIVE, SIX, SEVEN, EIGHT, NINE, TEN, ELEVEN, TWELVE, THIRTEEN, FOURTEEN, FIFTEEN, SIXTEEN, SEVENTEEN, EIGHTEEN, TWENTY, TWENTY ONE, TWENTY TWO, TWENTY THREE, TWENTY FOUR, TWENTY FIVE, TWENTY SIX, TWENTY SEVEN, TWENTY EIGHT, TWENTY NINE, THIRTY, THIRTY ONE WITH AVEOLOPLASTY  AND REMOVAL BILATERAL MANDIBULAR LINGUAL TORI (N/A Mouth)     Patient location during evaluation: PACU Anesthesia Type: General Level of consciousness: awake and alert Pain management: pain level controlled Vital Signs Assessment: post-procedure vital signs reviewed and stable Respiratory status: spontaneous breathing, nonlabored ventilation and respiratory function stable Cardiovascular status: blood pressure returned to baseline and stable Postop Assessment: no apparent nausea or vomiting Anesthetic complications: no    Last Vitals:  Vitals:   01/10/19 0930 01/10/19 0945  BP: (!) 151/99 (!) 132/93  Pulse: 81 79  Resp: (!) 9   Temp: (!) 36.2 C (!) 36.4 C  SpO2: 92% 93%    Last Pain:  Vitals:   01/10/19 0945  TempSrc: Oral  PainSc:                  [name]

## 2019-01-10 NOTE — H&P (Signed)
H&P documentation  -History and Physical Reviewed  -Patient has been re-examined  -No change in the plan of care  Autumn Estrada  

## 2019-01-10 NOTE — Anesthesia Procedure Notes (Signed)
Procedure Name: Intubation Date/Time: 01/10/2019 7:42 AM Performed by: Shirlyn Goltz, CRNA Pre-anesthesia Checklist: Patient identified, Emergency Drugs available, Suction available and Patient being monitored Patient Re-evaluated:Patient Re-evaluated prior to induction Oxygen Delivery Method: Circle system utilized Preoxygenation: Pre-oxygenation with 100% oxygen Induction Type: IV induction Ventilation: Mask ventilation without difficulty Laryngoscope Size: Mac and 3 Grade View: Grade I Nasal Tubes: Right, Nasal prep performed and Nasal Rae Tube size: 7.0 mm Number of attempts: 1 Placement Confirmation: ETT inserted through vocal cords under direct vision,  positive ETCO2 and breath sounds checked- equal and bilateral Tube secured with: Tape Dental Injury: Teeth and Oropharynx as per pre-operative assessment

## 2019-01-10 NOTE — Op Note (Signed)
01/10/2019  8:30 AM  PATIENT:  Autumn Estrada  53 y.o. female  PRE-OPERATIVE DIAGNOSIS:  NON-RESTORABLE TEETH NUMBERS TWO, THREE, FOUR, FIVE, SIX, SEVEN, EIGHT, NINE, TEN, ELEVEN, TWELVE, THIRTEEN, FOURTEEN, FIFTEEN, SIXTEEN, SEVENTEEN, EIGHTEEN, TWENTY, TWENTY ONE, TWENTY TWO, TWENTY THREE, TWENTY FOUR, TWENTY FIVE, TWENTY SIX, TWENTY SEVEN, TWENTY EIGHT, TWENTY NINE, THIRTY, THIRTY ONE,  BILATERAL MANDIBULAR LINGUAL TOR  POST-OPERATIVE DIAGNOSIS:  SAME  PROCEDURE:  Procedure(s): EXTRACTION TEETH NUMBERS TWO, THREE, FOUR, FIVE, SIX, SEVEN, EIGHT, NINE, TEN, ELEVEN, TWELVE, THIRTEEN, FOURTEEN, FIFTEEN, SIXTEEN, SEVENTEEN, EIGHTEEN, TWENTY, TWENTY ONE, TWENTY TWO, TWENTY THREE, TWENTY FOUR, TWENTY FIVE, TWENTY SIX, TWENTY SEVEN, TWENTY EIGHT, TWENTY NINE, THIRTY, THIRTY ONE WITH AVEOLOPLASTY  AND REMOVAL BILATERAL MANDIBULAR LINGUAL TORI  SURGEON:  Surgeon(s): Ocie Doyne, DDS  ANESTHESIA:   local and general  EBL:  minimal  DRAINS: none   SPECIMEN:  No Specimen  COUNTS:  YES  PLAN OF CARE: 23h obs  PATIENT DISPOSITION:  PACU - hemodynamically stable.   PROCEDURE DETAILS: Dictation # [id number]  Georgia Lopes, DMD 01/10/2019 8:30 AM

## 2019-01-10 NOTE — Progress Notes (Signed)
Patient refused at this time due to surgical intervention of mouth.

## 2019-01-10 NOTE — Op Note (Signed)
NAMEALIVYAH, HARDNETT MEDICAL RECORD VO:[id number] ACCOUNT [id number] DATE OF BIRTH:02-14-1966 FACILITY: [facility] LOCATION: [id number] PHYSICIAN:Jinx Gilden M. Gerron Guidotti, DDS  OPERATIVE REPORT  DATE OF PROCEDURE:  01/10/2019  PREOPERATIVE DIAGNOSIS:  Nonrestorable teeth numbers 2, 3, 4, 5, 6, 7, 7, 8, 9, 10, 11, 12, 13, 14, 15, 16, 17, 18, 20, 21, 22, 23, 24, 25, 26, 27, 28, 29, 30, 31 bilateral mandibular lingual tori.  POSTOPERATIVE DIAGNOSIS:  Nonrestorable teeth numbers 2, 3, 4, 5, 6, 7, 7, 8, 9, 10, 11, 12, 13, 14, 15, 16, 17, 18, 20, 21, 22, 23, 24, 25, 26, 27, 28, 29, 30, 31 bilateral mandibular lingual tori.  SURGEON:  Ocie Doyne, DDS  ANESTHESIA:  General nasal intubation.  DESCRIPTION OF PROCEDURE:  The patient was taken to the operating room and placed on the table in supine position.  General anesthesia was administered intravenously and a nasal endotracheal tube was placed and secured.  The eyes were protected and the  patient was draped for surgery.  A timeout was performed.  The posterior pharynx was suctioned and a throat pack was placed, 2% lidocaine 1:100,000 epinephrine was infiltrated in an inferior alveolar block on the right and left sides and buccal and  palatal infiltration in the maxilla total of 20 mL was utilized.  A bite block was placed on the right side of the mouth and a sweetheart retractor was used to retract the tongue.  A #15 blade was used to make an incision buccally and lingually in the  gingival sulcus from tooth 17 to tooth 26 in the mandible and from tooth 16 in the maxilla to tooth 7.  A maxillary incision was created in the buccal and palatal gingival sulcus.  The periosteum was reflected and the teeth were elevated with a 301  elevator and the mandible teeth numbers 17, 18, 20, 21, 22, 23, 24 and 25 were removed using Ash forceps.  Tooth 26 fractured upon attempted removal with the Ash forceps.  Then bone was removed from around the residual root and the root was  removed with  a 301 elevator.  In the maxilla the teeth were elevated and then upper forceps was used to remove teeth numbers 16, 13, 11, 10, 9, 8, 7.  Teeth 15, 14 and 12 fractured during attempted removal, necessitating removal of circumferential bone around the  roots and removal of the roots with the 301 elevator.  Then, the sockets were curetted.  The periosteum was reflected to expose the alveolar crest and lingual torus on the left side, the torus was removed using an egg-shaped bur under irrigation and a  Stryker handpiece and then a bone file was used to further smooth the area.  Then, alveoplasty was performed using an egg-shaped bur and bone file in the left maxilla and mandible.  Then, the areas were irrigated and closed with 3-0 chromic.  Then, the  bite block was repositioned to the other side of the mouth.  The sweetheart retractor was used to retract the tongue.  A #15 blade was used to make an incision in the mandible beginning at tooth 30 carrying forward to tooth 27 buccally and gingivally  lingually in the gingival sulcus.  Then in the maxilla the incision was created buccally and palatally from tooth 2 through tooth 6.  The periosteum was reflected from around this tooth.  The teeth were elevated with a 301 elevator and then teeth 27 and  28 were removed with the Ash forceps.  Teeth 29, 30  and 31 were partially removed as the crown fractured on these teeth.  Then, the Stryker handpiece with a fissure bur was used to remove interproximal bone these teeth and the roots were removed with a  301 elevator.  In the maxilla the upper forceps was used to remove teeth 2, 4, 5, and 6.  Tooth 3 fractured upon removal and then bone was removed to allow removal of the remaining palatal root.  Then, these areas were curetted.  The periosteum was  reflected to expose the alveolar crest and the torus in the right mandible.  The right mandibular torus was removed using an egg-shaped bur under irrigation  and then further it was smoothed with a bone file.  Then alveoplasty was performed on the right  maxilla and mandible using the egg-shaped bur and bone file.  Then, the areas were irrigated and closed with 3-0 chromic.  The oral cavity was then inspected, suctioned after irrigation and the throat pack was removed.  The patient was left in care of  anesthesia for extubation and transport to recovery room with plans for a 23-hour observation.  ESTIMATED BLOOD LOSS:  Minimal.  COMPLICATIONS:  None.  SPECIMENS:  None.  TN/NUANCE  D:01/10/2019 T:01/10/2019 JOB:[id number]

## 2019-01-11 ENCOUNTER — Encounter (HOSPITAL_COMMUNITY): Payer: Self-pay | Admitting: Oral Surgery

## 2019-01-11 DIAGNOSIS — K029 Dental caries, unspecified: Secondary | ICD-10-CM | POA: Diagnosis not present

## 2019-01-11 MED ORDER — AMOXICILLIN 500 MG PO CAPS: 500.0000 mg | ORAL_CAPSULE | Freq: Three times a day (TID) | ORAL | 0 refills | Status: AC

## 2019-01-11 MED ORDER — IBUPROFEN 800 MG PO TABS: 800.0000 mg | ORAL_TABLET | Freq: Three times a day (TID) | ORAL | 0 refills | Status: AC | PRN

## 2019-01-11 MED ORDER — OXYCODONE-ACETAMINOPHEN 10-325 MG PO TABS: 1.0000 | ORAL_TABLET | Freq: Four times a day (QID) | ORAL | 0 refills | Status: AC | PRN

## 2019-01-11 MED ORDER — PROMETHAZINE HCL 50 MG PO TABS: 25.0000 mg | ORAL_TABLET | Freq: Four times a day (QID) | ORAL | 0 refills | Status: AC | PRN

## 2019-01-11 MED ORDER — PROMETHAZINE HCL 25 MG/ML IJ SOLN
25.0000 mg | Freq: Four times a day (QID) | INTRAMUSCULAR | Status: DC | PRN
  Administered 2019-01-11 (×2): 25 mg via INTRAVENOUS
  Filled 2019-01-11 (×2): qty 1

## 2019-01-11 NOTE — Discharge Summary (Signed)
Patient ID: Autumn Estrada MRN: [id number] DOB/AGE: [birth year] 53 y.o.  Admit date: 01/10/2019 Discharge date: 01/11/2019  Admission Diagnoses: Post-op observation  Discharge Diagnoses:  Active Problems:   Post-operative state   Discharged Condition: stable  Hospital Course: Patient admitted for 23 h obs. Post op pain meds, fluids, antinausea medications given.  Consults: None  Significant Diagnostic Studies: none  Treatments: IV hydration, antibiotics: Ancef, analgesia: Dilaudid and surgery: Full mouth extractions with alveoloplasty and removal mandibular lingual tori  Discharge Exam: Blood pressure 116/69, pulse 72, temperature 97.7 F (36.5 C), temperature source Oral, resp. rate 14, height 5\' 7"  (1.702 m), weight 84.8 kg, SpO2 94 %. General appearance: alert and mild distress Head: Normocephalic, without obvious abnormality, atraumatic Eyes: negative Nose: Nares normal. Septum midline. Mucosa normal. No drainage or sinus tenderness. Throat: mild soft peri-oral edema, pharynx clear. intraoral ecchymosis sublingual. hemostatic. sutures intact Neck: submental ecchymosis, soft edema right and left jawline Resp: clear to auscultation bilaterally Cardio: regular rate and rhythm, S1, S2 normal, no murmur, click, rub or gallop  Disposition: Discharge disposition: 01-Home or Self Care       Discharge Instructions    Call MD for:  difficulty breathing, headache or visual disturbances   Complete by:  As directed    Call MD for:  persistant nausea and vomiting   Complete by:  As directed    Call MD for:  severe uncontrolled pain   Complete by:  As directed    Call MD for:  temperature >100.4   Complete by:  As directed    Diet - low sodium heart healthy   Complete by:  As directed    Soft Diet. Advance as tolerated.   Discharge instructions   Complete by:  As directed    Ice to affected area for 2-3 days. Soft diet, advance as tolerated. Warm salt water mouth rinses 4-5  times per day starting the day after surgery. No smoking for 2 weeks. For problems, call [phone].   Increase activity slowly   Complete by:  As directed      Allergies as of 01/11/2019      Reactions   Pregabalin Other (See Comments)   agitation      Medication List    TAKE these medications   ALPRAZolam 1 MG tablet Commonly known as:  XANAX Take 1.5 mg by mouth 3 (three) times daily.   amoxicillin 500 MG capsule Commonly known as:  AMOXIL Take 1 capsule (500 mg total) by mouth 3 (three) times daily.   carisoprodol 350 MG tablet Commonly known as:  SOMA Take 350 mg by mouth 3 (three) times daily.   gabapentin 600 MG tablet Commonly known as:  NEURONTIN Take 1,200 mg by mouth 3 (three) times daily.   GOODY HEADACHE PO Take 1-2 packets by mouth 2 (two) times daily as needed (migraines).   ibuprofen 800 MG tablet Commonly known as:  ADVIL,MOTRIN Take 1 tablet (800 mg total) by mouth every 8 (eight) hours as needed.   lamoTRIgine 200 MG tablet Commonly known as:  LAMICTAL Take 200 mg by mouth 2 (two) times daily.   levocetirizine 5 MG tablet Commonly known as:  XYZAL Take 5 mg by mouth at bedtime.   ondansetron 4 MG disintegrating tablet Commonly known as:  ZOFRAN-ODT Take 4 mg by mouth 3 (three) times daily.   oxyCODONE-acetaminophen 10-325 MG tablet Commonly known as:  PERCOCET Take 1 tablet by mouth every 6 (six) hours as needed for pain.   promethazine  50 MG tablet Commonly known as:  PHENERGAN Take 0.5 tablets (25 mg total) by mouth every 6 (six) hours as needed for nausea or vomiting.   sertraline 100 MG tablet Commonly known as:  ZOLOFT Take 200 mg by mouth daily.   traZODone 100 MG tablet Commonly known as:  DESYREL Take 200 mg by mouth at bedtime.        Signed: Ocie Doyne 01/11/2019, 9:12 AM

## 2019-01-11 NOTE — Plan of Care (Signed)
  Problem: Education: Goal: Knowledge of General Education information will improve Description: Including pain rating scale, medication(s)/side effects and non-pharmacologic comfort measures Outcome: Progressing   Problem: Health Behavior/Discharge Planning: Goal: Ability to manage health-related needs will improve Outcome: Progressing   Problem: Clinical Measurements: Goal: Ability to maintain clinical measurements within normal limits will improve Outcome: Progressing Goal: Will remain free from infection Outcome: Progressing   Problem: Activity: Goal: Risk for activity intolerance will decrease Outcome: Progressing   Problem: Elimination: Goal: Will not experience complications related to urinary retention Outcome: Progressing   Problem: Pain Managment: Goal: General experience of comfort will improve Outcome: Progressing   Problem: Safety: Goal: Ability to remain free from injury will improve Outcome: Progressing   Problem: Skin Integrity: Goal: Risk for impaired skin integrity will decrease Outcome: Progressing   

## 2019-01-11 NOTE — Progress Notes (Signed)
Patient discharged to home with instructions and prescription. 

## 2019-01-11 NOTE — Progress Notes (Addendum)
Pt. has complained of nausea all throughout the night that is largely unrelieved by antiemetic. Pt. has also had episodes of emesis throughout the night. Pt. has also had a persistent migraine throughout the night that is unrelieved by pain medication.

## 2019-12-07 IMAGING — CR DG CHEST 2V
2 series · 2 of 2 positions shown · non-contrast
Comparison: None.

CLINICAL DATA: Acute onset of sore throat and productive cough.
Generalized body aches.

EXAM:
CHEST - 2 VIEW

[w chest pa]
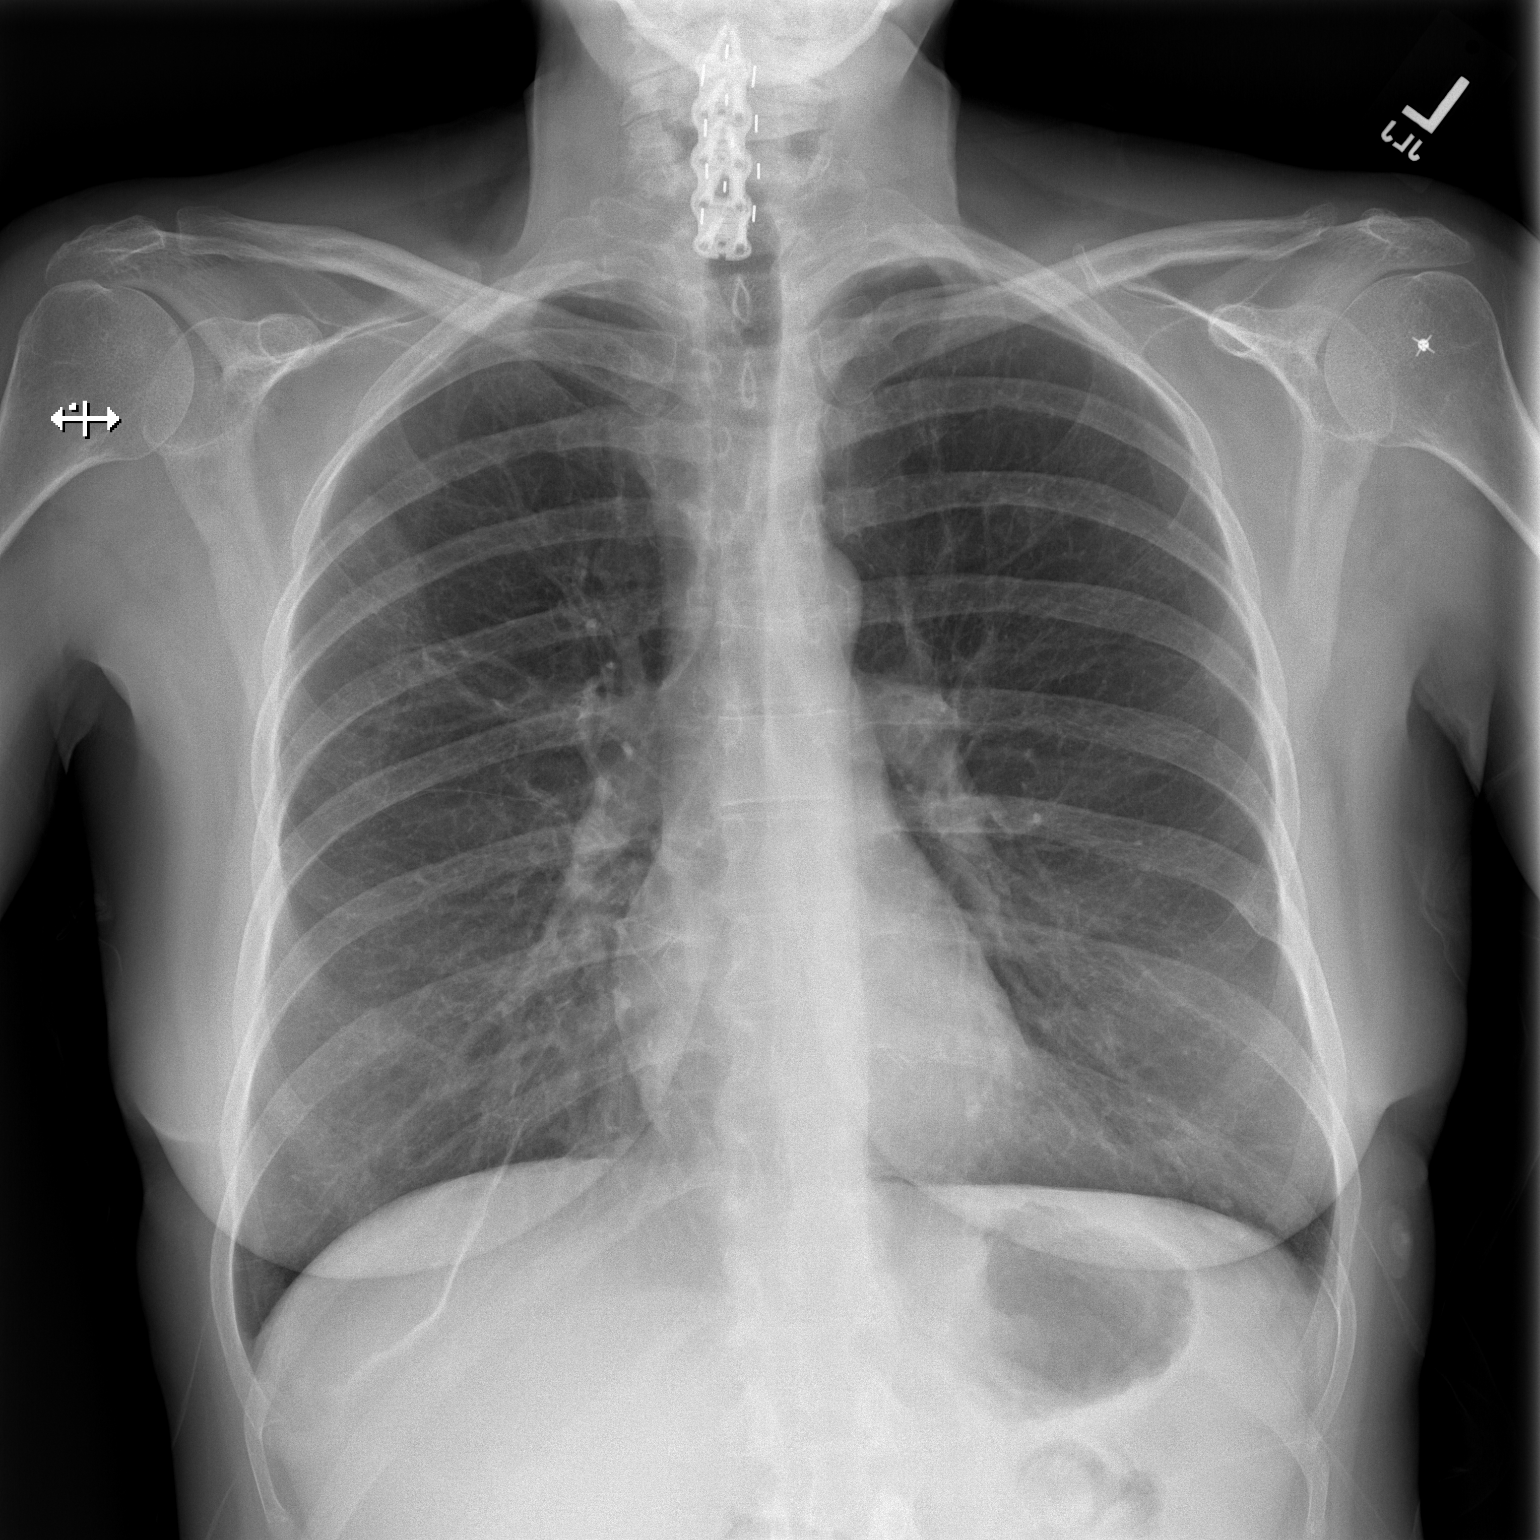

[w chest lat]
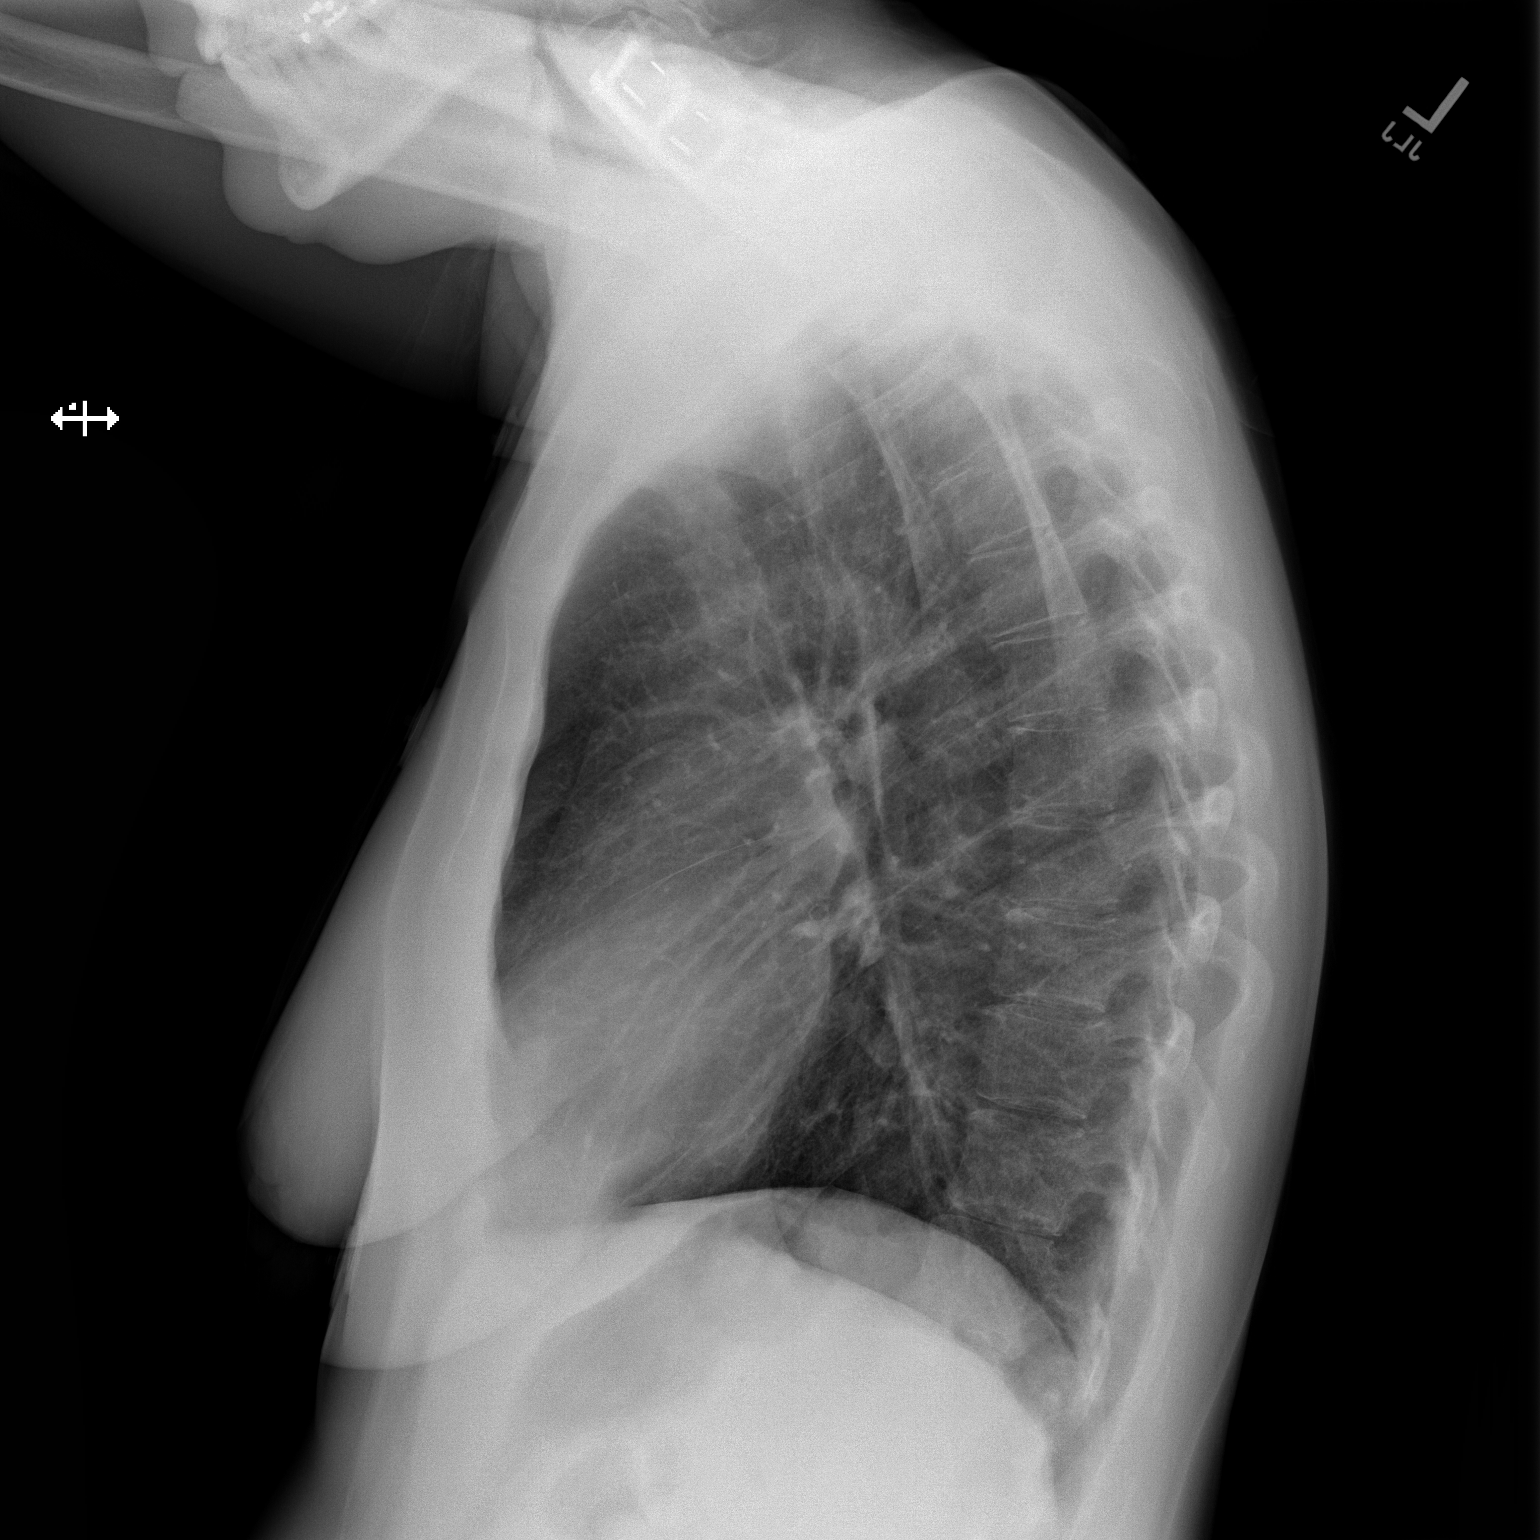

[2 of 2 positions shown; findings below may reference images not displayed]

FINDINGS: The lungs are well-aerated and clear. There is no evidence of focal
opacification, pleural effusion or pneumothorax.

The heart is normal in size; the mediastinal contour is within
normal limits. No acute osseous abnormalities are seen. Cervical
spinal fusion hardware is noted. The patient is status post
left-sided rotator cuff repair.
IMPRESSION: No acute cardiopulmonary process seen.

## 2021-07-14 ENCOUNTER — Emergency Department (HOSPITAL_COMMUNITY)
Admission: EM | Admit: 2021-07-14 | Discharge: 2021-07-15 | Disposition: A | Discharge status: Other Acute Inpatient Hospital | Payer: Medicare Other | Attending: Emergency Medicine | Admitting: Emergency Medicine

## 2021-07-14 ENCOUNTER — Other Ambulatory Visit: Payer: Self-pay

## 2021-07-14 ENCOUNTER — Ambulatory Visit (INDEPENDENT_AMBULATORY_CARE_PROVIDER_SITE_OTHER)
Admission: EM | Admit: 2021-07-14 | Discharge: 2021-07-14 | Disposition: A | Discharge status: Other Acute Inpatient Hospital | Payer: Medicare Other | Source: Home / Self Care

## 2021-07-14 ENCOUNTER — Encounter (HOSPITAL_COMMUNITY): Payer: Self-pay | Admitting: Emergency Medicine

## 2021-07-14 DIAGNOSIS — F4310 Post-traumatic stress disorder, unspecified: Secondary | ICD-10-CM | POA: Insufficient documentation

## 2021-07-14 DIAGNOSIS — R45851 Suicidal ideations: Secondary | ICD-10-CM | POA: Insufficient documentation

## 2021-07-14 DIAGNOSIS — F332 Major depressive disorder, recurrent severe without psychotic features: Secondary | ICD-10-CM | POA: Insufficient documentation

## 2021-07-14 DIAGNOSIS — R569 Unspecified convulsions: Secondary | ICD-10-CM | POA: Diagnosis not present

## 2021-07-14 DIAGNOSIS — Z20822 Contact with and (suspected) exposure to covid-19: Secondary | ICD-10-CM | POA: Diagnosis not present

## 2021-07-14 DIAGNOSIS — Z87891 Personal history of nicotine dependence: Secondary | ICD-10-CM | POA: Insufficient documentation

## 2021-07-14 DIAGNOSIS — M791 Myalgia, unspecified site: Secondary | ICD-10-CM | POA: Insufficient documentation

## 2021-07-14 DIAGNOSIS — F32A Depression, unspecified: Secondary | ICD-10-CM | POA: Insufficient documentation

## 2021-07-14 DIAGNOSIS — Z79899 Other long term (current) drug therapy: Secondary | ICD-10-CM | POA: Diagnosis not present

## 2021-07-14 LAB — POCT URINE DRUG SCREEN - MANUAL ENTRY (I-SCREEN)
POC Amphetamine UR: NOT DETECTED
POC Buprenorphine (BUP): NOT DETECTED
POC Cocaine UR: NOT DETECTED
POC Marijuana UR: POSITIVE — AB
POC Methadone UR: NOT DETECTED
POC Methamphetamine UR: NOT DETECTED
POC Morphine: NOT DETECTED
POC Oxazepam (BZO): NOT DETECTED
POC Oxycodone UR: NOT DETECTED
POC Secobarbital (BAR): NOT DETECTED

## 2021-07-14 LAB — COMPREHENSIVE METABOLIC PANEL
ALT: 14 U/L (ref 0–44)
AST: 27 U/L (ref 15–41)
Albumin: 4 g/dL (ref 3.5–5.0)
Alkaline Phosphatase: 79 U/L (ref 38–126)
Anion gap: 11 (ref 5–15)
BUN: 12 mg/dL (ref 6–20)
CO2: 23 mmol/L (ref 22–32)
Calcium: 9.9 mg/dL (ref 8.9–10.3)
Chloride: 102 mmol/L (ref 98–111)
Creatinine, Ser: 0.72 mg/dL (ref 0.44–1.00)
GFR, Estimated: >60 mL/min (ref >60)
Glucose, Bld: 95 mg/dL (ref 70–99)
Potassium: 3.7 mmol/L (ref 3.5–5.1)
Sodium: 136 mmol/L (ref 135–145)
Total Bilirubin: 0.6 mg/dL (ref 0.3–1.2)
Total Protein: 7 g/dL (ref 6.5–8.1)

## 2021-07-14 LAB — CBC WITH DIFFERENTIAL/PLATELET
Abs Immature Granulocytes: 0.02 10*3/uL (ref 0.00–0.07)
Basophils Absolute: 0.1 10*3/uL (ref 0.0–0.1)
Basophils Relative: 2 %
Eosinophils Absolute: 0.2 10*3/uL (ref 0.0–0.5)
Eosinophils Relative: 3 %
HCT: 42.4 % (ref 36.0–46.0)
Hemoglobin: 13.5 g/dL (ref 12.0–15.0)
Immature Granulocytes: 0 %
Lymphocytes Relative: 30 %
Lymphs Abs: 1.8 10*3/uL (ref 0.7–4.0)
MCH: 27.5 pg (ref 26.0–34.0)
MCHC: 31.8 g/dL (ref 30.0–36.0)
MCV: 86.4 fL (ref 80.0–100.0)
Monocytes Absolute: 0.6 10*3/uL (ref 0.1–1.0)
Monocytes Relative: 11 %
Neutro Abs: 3.2 10*3/uL (ref 1.7–7.7)
Neutrophils Relative %: 54 %
Platelets: 341 10*3/uL (ref 150–400)
RBC: 4.91 MIL/uL (ref 3.87–5.11)
RDW: 16.7 % — ABNORMAL HIGH (ref 11.5–15.5)
WBC: 5.9 10*3/uL (ref 4.0–10.5)
nRBC: 0 % (ref 0.0–0.2)

## 2021-07-14 LAB — TSH: TSH: 2.266 u[IU]/mL (ref 0.350–4.500)

## 2021-07-14 LAB — RESP PANEL BY RT-PCR (FLU A&B, COVID) ARPGX2
Influenza A by PCR: NEGATIVE
Influenza B by PCR: NEGATIVE
SARS Coronavirus 2 by RT PCR: NEGATIVE

## 2021-07-14 LAB — POC SARS CORONAVIRUS 2 AG: SARSCOV2ONAVIRUS 2 AG: NEGATIVE

## 2021-07-14 LAB — ETHANOL: Alcohol, Ethyl (B): <10 mg/dL (ref <10)

## 2021-07-14 MED ORDER — CARISOPRODOL 350 MG PO TABS
350.0000 mg | ORAL_TABLET | Freq: Three times a day (TID) | ORAL | Status: DC
  Administered 2021-07-15 (×2): 350 mg via ORAL
  Filled 2021-07-14 (×2): qty 1

## 2021-07-14 MED ORDER — GABAPENTIN 300 MG PO CAPS
1200.0000 mg | ORAL_CAPSULE | Freq: Three times a day (TID) | ORAL | Status: DC
  Administered 2021-07-15 (×2): 1200 mg via ORAL
  Filled 2021-07-14 (×2): qty 4

## 2021-07-14 MED ORDER — ONDANSETRON 4 MG PO TBDP
8.0000 mg | ORAL_TABLET | Freq: Three times a day (TID) | ORAL | Status: DC
  Administered 2021-07-15 (×2): 8 mg via ORAL
  Filled 2021-07-14 (×2): qty 2

## 2021-07-14 MED ORDER — TRAZODONE HCL 50 MG PO TABS
200.0000 mg | ORAL_TABLET | Freq: Every day | ORAL | Status: DC
  Administered 2021-07-15: 200 mg via ORAL
  Filled 2021-07-14: qty 4

## 2021-07-14 MED ORDER — LORATADINE 10 MG PO TABS
10.0000 mg | ORAL_TABLET | Freq: Every day | ORAL | Status: DC
  Administered 2021-07-15: 10 mg via ORAL
  Filled 2021-07-14: qty 1

## 2021-07-14 MED ORDER — ALPRAZOLAM 0.25 MG PO TABS
1.5000 mg | ORAL_TABLET | Freq: Three times a day (TID) | ORAL | Status: DC
  Administered 2021-07-15 (×2): 1.5 mg via ORAL
  Filled 2021-07-14 (×2): qty 6

## 2021-07-14 MED ORDER — LOPERAMIDE HCL 2 MG PO CAPS
4.0000 mg | ORAL_CAPSULE | Freq: Every day | ORAL | Status: DC
  Administered 2021-07-15: 4 mg via ORAL
  Filled 2021-07-14: qty 2

## 2021-07-14 MED ORDER — CENOBAMATE 200 MG PO TABS: 200.0000 mg | ORAL_TABLET | Freq: Every day | ORAL | Status: DC

## 2021-07-14 MED ORDER — SERTRALINE HCL 100 MG PO TABS
200.0000 mg | ORAL_TABLET | Freq: Every day | ORAL | Status: DC
  Administered 2021-07-15: 200 mg via ORAL
  Filled 2021-07-14: qty 2
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
## 2021-07-14 NOTE — ED Provider Notes (Signed)
[facility] EMERGENCY DEPARTMENT Provider Note   CSN: [id number] Arrival date & time: 07/14/21  2216     History Chief Complaint  Patient presents with   Depression    Autumn Estrada is a 55 y.o. female.  The history is provided by the patient and medical records.  Depression    SI for the past 6 months and has thought about using gun to shoot herself or knives to slit her wrist.  She did have son remove weapons from the home for her own safety.  She has been attending therapy for the past 5 months via telemedicine service but does not feel like she has made much progress from a mental standpoint.  She admits past stressors and trauma are still affecting her.  She has been compliant with her psychiatric medications.  Patient lives in a camper, did not feel safe to return there as she felt like she would harm herself.  Had full evaluation at [facility] this evening, recommended for IP but no appropriate beds available at [facility] currently.  Past Medical History:  Diagnosis Date   Anxiety    Arthritis    Back pain    Degenerative disc disease, lumbar    Depression    Headache    migraines   Pneumonia    Seizures (HCC)    Sleep apnea    no cpap per pt   Spinal stenosis     Patient Active Problem List   Diagnosis Date Noted   Post-operative state 01/10/2019    Past Surgical History:  Procedure Laterality Date   ABDOMINAL HYSTERECTOMY     ANTERIOR CERVICAL DECOMP/DISCECTOMY FUSION     COLONOSCOPY     HERNIA REPAIR     umbilical hernia   SHOULDER SURGERY Left    rotator cuff tear   TOOTH EXTRACTION N/A 01/10/2019   Procedure: EXTRACTION TEETH NUMBERS TWO, THREE, FOUR, FIVE, SIX, SEVEN, EIGHT, NINE, TEN, ELEVEN, TWELVE, THIRTEEN, FOURTEEN, FIFTEEN, SIXTEEN, SEVENTEEN, EIGHTEEN, TWENTY, TWENTY ONE, TWENTY TWO, TWENTY THREE, TWENTY FOUR, TWENTY FIVE, TWENTY SIX, TWENTY SEVEN, TWENTY EIGHT, TWENTY NINE, THIRTY, THIRTY ONE WITH AVEOLOPLASTY  AND REMOVAL BILATERAL  MANDIBULAR LINGUAL TORI;  Surgeon: Ocie Doyne, D     OB History   No obstetric history on file.     No family history on file.  Social History   Tobacco Use   Smoking status: Former   Smokeless tobacco: Never   Tobacco comments:    quit in 2014  Vaping Use   Vaping Use: Every day  Substance Use Topics   Alcohol use: No   Drug use: No    Home Medications Prior to Admission medications   Medication Sig Start Date End Date Taking? Authorizing Provider  ALPRAZolam Prudy Feeler) 1 MG tablet Take 1.5 mg by mouth 3 (three) times daily.    [provider]  amoxicillin (AMOXIL) 500 MG capsule Take 1 capsule (500 mg total) by mouth 3 (three) times daily. 01/11/19   Ocie Doyne, DMD  Aspirin-Acetaminophen-Caffeine (GOODY HEADACHE PO) Take 1-2 packets by mouth 2 (two) times daily as needed (migraines).    [provider]  carisoprodol (SOMA) 350 MG tablet Take 350 mg by mouth 3 (three) times daily.    [provider]  gabapentin (NEURONTIN) 600 MG tablet Take 1,200 mg by mouth 3 (three) times daily.    [provider]  ibuprofen (ADVIL,MOTRIN) 800 MG tablet Take 1 tablet (800 mg total) by mouth every 8 (eight) hours as needed.  01/11/19   Ocie Doyne, DMD  lamoTRIgine (LAMICTAL) 200 MG tablet Take 200 mg by mouth 2 (two) times daily.    [provider]  levocetirizine (XYZAL) 5 MG tablet Take 5 mg by mouth at bedtime.    [provider]  ondansetron (ZOFRAN-ODT) 4 MG disintegrating tablet Take 4 mg by mouth 3 (three) times daily.    [provider]  oxyCODONE-acetaminophen (PERCOCET) 10-325 MG tablet Take 1 tablet by mouth every 6 (six) hours as needed for pain. 01/11/19   Ocie Doyne, DMD  promethazine (PHENERGAN) 50 MG tablet Take 0.5 tablets (25 mg total) by mouth every 6 (six) hours as needed for nausea or vomiting. 01/11/19   Ocie Doyne, DMD  sertraline (ZOLOFT) 100 MG tablet Take 200 mg by mouth daily.    [provider]  traZODone (DESYREL) 100 MG tablet Take 200 mg by mouth at bedtime.    [provider]    Allergies    Pregabalin  Review of Systems   Review of Systems  Psychiatric/Behavioral:  Positive for depression.   All other systems reviewed and are negative.  Physical Exam Updated Vital Signs BP 123/78 (BP Location: Left Arm)   Pulse 81   Temp 99.4 F (37.4 C)   Resp 20   SpO2 95%   Physical Exam Vitals and nursing note reviewed.  Constitutional:      Appearance: She is well-developed.  HENT:     Head: Normocephalic and atraumatic.  Eyes:     Conjunctiva/sclera: Conjunctivae normal.     Pupils: Pupils are equal, round, and reactive to light.  Cardiovascular:     Rate and Rhythm: Normal rate and regular rhythm.     Heart sounds: Normal heart sounds.  Pulmonary:     Effort: Pulmonary effort is normal.     Breath sounds: Normal breath sounds.  Abdominal:     General: Bowel sounds are normal.     Palpations: Abdomen is soft.  Musculoskeletal:        General: Normal range of motion.     Cervical back: Normal range of motion.  Skin:    General: Skin is warm and dry.  Neurological:     Mental Status: She is alert and oriented to person, place, and time.  Psychiatric:        Thought Content: Thought content includes suicidal ideation. Thought content includes suicidal plan.     Comments: tearful    ED Results / Procedures / Treatments   Labs (all labs ordered are listed, but only abnormal results are displayed) Labs Reviewed - No data to display  EKG None  Radiology No results found.  Procedures Procedures   Medications Ordered in ED Medications  ALPRAZolam (XANAX) tablet 1.5 mg (has no administration in time range)  carisoprodol (SOMA) tablet 350 mg (has no administration in time range)  gabapentin (NEURONTIN) capsule 1,200 mg (has no administration in time range)  loratadine (CLARITIN) tablet 10 mg (has no administration in time range)   loperamide (IMODIUM) capsule 4 mg (has no administration in time range)  ondansetron (ZOFRAN-ODT) disintegrating tablet 8 mg (has no administration in time range)  sertraline (ZOLOFT) tablet 200 mg (has no administration in time range)  traZODone (DESYREL) tablet 200 mg (has no administration in time range)  lamoTRIgine (LAMICTAL) tablet 200 mg (has no administration in time range)    ED Course  I have reviewed the triage vital signs and the nursing notes.  Pertinent labs & imaging results that  were available during my care of the patient were reviewed by me and considered in my medical decision making (see chart for details).    MDM Rules/Calculators/A&P                           55 y.o. F transferred to ED from [facility] for IP placement.  Has had some ongoing SI for several weeks, plan to shoot or cut herself.  Did not feel safe returning home to her camper.  She is tearful here in the ED but denies any additional complaints.  Had labs and covid test performed this evening at [facility] which area reassuring.  Home meds ordered, awaiting IP placement.  TTS to follow.  2:12 AM Notified by pharmacy that we do not have patient's regularly scheduled cenobamate.  She was previously taking lamictal 200mg  BID so pharmacy suggested to switch back to that temporarily.  Patient denies any issues tolerating lamictal in the past.  Orders adjusted accordingly.  Final Clinical Impression(s) / ED Diagnoses Final diagnoses:  Suicidal ideation    Rx / DC Orders ED Discharge Orders     None        , PA-C 07/15/21 0327    Tegeler, 07/17/21, MD 07/18/21 [phone]

## 2021-07-14 NOTE — ED Triage Notes (Signed)
Patient comes from [facility] with depression.  Patient is tearful in triage.  Patient does have suicidal ideation with no plan.
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
## 2021-07-14 NOTE — Progress Notes (Signed)
Patient is alert and oriented X 4 with active SI. Patient tearful, oriented to obs waiting area while awaiting an inpatient bed. Patient has four superficial scratches to hands which patient informed RN she received from her kitten. Patient offered food. Provider seeking bed at [facility].

## 2021-07-14 NOTE — ED Provider Notes (Addendum)
Behavioral Health Urgent Care Medical Screening Exam  Patient Name: Autumn Estrada MRN: [id number] Date of Evaluation: 07/14/21 Chief Complaint:  "suicidal thoughts, worsening depression and anxiety" Diagnosis:  Final diagnoses:  MDD (major depressive disorder), recurrent severe, without psychosis (HCC)  Suicidal ideation    History of Present illness: Autumn Estrada is a 55 y.o. female who presents to the [facility] as a walk-in voluntarily accompanied by her mother "Autumn Gin" Estrada for an evaluation for suicidal thoughts, worsening depression and anxiety. Past has a past psychiatric hx of PTSD, anxiety, panic attacks and depression.   Patient seen and evaluated face-to-face by this provider with her mother present. Patient reports having suicidal thoughts for the past 6 months and has thought about using a gun and a knife to cut her wrists.  She states that all the knives and guns have been removed from the house. She states that she told her son to remove the pistol from her home. She states that she's  had a lot of thoughts about hurting herself the last couple of weeks and its been building up. She identifies past traumas as stressors to her depression and suicidality. She does not further elaborate on the past traumas and begins crying. She identifies her depressive symptoms as sadness, worthlessness, hopelessness, decreased energy level, crying spells, poor appetite, poor sleep, and a lack of interest and pleasure in activities. She reports going weeks without eating, desire to cook or bathe. She denies auditory and visual hallucinations. She reports a history of depression since she was 55 years old. She states that she is prescribed Zoloft 200 mg daily, Xanax 0.5 mg 3 times a day, trazodone 200 mg at bedtime, and gabapentin 1200 mg daily, for depression and anxiety by her  neurologist. She reports taking the stated medications for the past 14 years. She reports attending therapy via telephone for the  past 5 months that she feels is not effective. She states that she does not feel safe returning home to her camper and is afraid to be alone. She states that if she returns home she would be thinking of different things to do to hurt herself. She reports a medical history of degenerative nerve damage, neck surgery, herniated disc, and seizures. She reports taking Xocopri for seizures and is med compliant. She reports taking Soma for muscle pain. She reports taking Zyrtec and Flonase for allergies.  She reports that she performs all of her ADLs.  She reports that she ambulates without an assistive device. She denies drinking alcohol or using illicit substance. She states that she was hospitalized 14 years ago for a mental breakdown. Her mother states that they have a family history of addiction. The patient states that her oldest son attempted suicide in the past.  Psychiatric Specialty Exam  Presentation  General Appearance:Appropriate for Environment  Eye Contact:Fair  Speech:Clear and Coherent  Speech Volume:Normal  Handedness:Right   Mood and Affect  Mood:Depressed  Affect:Congruent   Thought Process  Thought Processes:Coherent  Descriptions of Associations:Intact  Orientation:Full (Time, Place and Person)  Thought Content:WDL  Diagnosis of Schizophrenia or Schizoaffective disorder in past: No   Hallucinations:None  Ideas of Reference:None  Suicidal Thoughts:Yes, Active With Plan  Homicidal Thoughts:No   Sensorium  Memory:Immediate Fair; Recent Fair; Remote Fair  Judgment:Fair  Insight:Fair   Executive Functions  Concentration:Fair  Attention Span:Fair  Recall:Fair  Fund of Knowledge:Fair  Language:Fair   Psychomotor Activity  Psychomotor Activity:Decreased   Assets  Assets:Communication Skills; Desire for Improvement; Housing; Leisure Time; Social  Support   Sleep  Sleep:Poor  Number of hours: 2   No data recorded  Physical  Exam: Physical Exam HENT:     Head: Normocephalic.  Eyes:     Conjunctiva/sclera: Conjunctivae normal.  Cardiovascular:     Rate and Rhythm: Normal rate.  Pulmonary:     Effort: Pulmonary effort is normal.  Musculoskeletal:        General: Normal range of motion.     Cervical back: Normal range of motion.  Neurological:     Mental Status: She is alert and oriented to person, place, and time.  Psychiatric:        Mood and Affect: Mood is depressed.   Review of Systems  Constitutional: Negative.   HENT: Negative.    Eyes: Negative.   Respiratory: Negative.    Cardiovascular: Negative.   Gastrointestinal: Negative.   Genitourinary: Negative.   Musculoskeletal:  Positive for back pain.       Chronic back pain   Skin: Negative.   Neurological: Negative.   Endo/Heme/Allergies: Negative.   Psychiatric/Behavioral:  Positive for depression and suicidal ideas. Negative for hallucinations and substance abuse.   Blood pressure 126/78, pulse 92, temperature 98.9 F (37.2 C), temperature source Oral, resp. rate 16, SpO2 96 %. There is no height or weight on file to calculate BMI.  Musculoskeletal: Strength & Muscle Tone: within normal limits Gait & Station: normal Patient leans: N/A   [facility] MSE Discharge Disposition for Follow up and Recommendations: Based on my evaluation I certify that psychiatric inpatient services furnished can reasonably be expected to improve the patient's condition which I recommend transfer to an appropriate accepting facility.  Patient does not meet criteria for the [facility] [place] as she is a resident of [place].  Patient under review at [facility] for inpatient treatment.   Lab Orders         Resp Panel by RT-PCR (Flu A&B, Covid) Nasopharyngeal Swab         CBC with Differential/Platelet         Comprehensive metabolic panel         TSH         Ethanol         POCT Urine Drug Screen - (ICup)         POC SARS Coronavirus 2 Ag-ED - Nasal Swab      Layla Barter, NP 07/14/2021, 5:27 PM

## 2021-07-14 NOTE — ED Notes (Signed)
Pt given dinner  

## 2021-07-15 MED ORDER — LAMOTRIGINE 100 MG PO TABS
200.0000 mg | ORAL_TABLET | Freq: Two times a day (BID) | ORAL | Status: DC
  Administered 2021-07-15 (×2): 200 mg via ORAL
  Filled 2021-07-15 (×4): qty 2

## 2021-07-15 NOTE — ED Notes (Signed)
Safe transport was call for PT to go to [facility], 1 hour and a half wait

## 2021-07-15 NOTE — ED Notes (Signed)
Pt making a phone call at this time. ?

## 2021-07-15 NOTE — ED Notes (Signed)
Pt's lunch has arrived, pt sitting up and eating his lunch now. Will continue to monitor pt.

## 2021-07-15 NOTE — ED Notes (Addendum)
Pt asked for her contact list to be updated on her chart, since there was an old number for her mother.  This Clinical research associate changed and added the following: Gigi Gin, Mother: [phone] Jacalyn Lefevre, Daughter: [phone] And Hinda Glatter [phone].  Pt would like to have her son, Thayer Ohm, NOT to be contacted under any circumstance or given any information. Her son is one of her triggers. Will continue to monitor pt.

## 2021-07-15 NOTE — Progress Notes (Signed)
Patient has been denied by [facility] due to no beds available. Patient has been faxed out. Patient meets inpatient criteria per Liborio Nixon ,NP. Patient referred to the following facilities:  [facility]  [address], [place] [address] [phone]  [facility]  [address], [place] [address] [phone]  [facility]  [address] [place] [address] [phone]  [facility]  [address] [place] [address] [phone]  [facility]  [address] [place] [address] [phone]  [facility]  [address] [place], [place] [address] [phone]  [facility]  [address] [place], [place] [address] [phone]  [facility]  [address] [place] [address] [phone]  [facility]  [address] [place] [address] [phone]  [facility]  [address], [place] [address] [phone]  [facility]  [address], [place] [address] [phone]  [facility]  [address] [place] [address] [phone]  [facility]  [address], [place] [address] [phone]  [facility]  [address] [place] [address] [phone]  [facility]  [address] [place], [place] [address] [phone]  [facility]  [address] [place] [address] [phone]  [facility]  [address] [place] [address] [phone]  [facility]   [address], [place] [address] [phone]  [facility]  [address], [place] [address] [phone]  [facility]  [address] [place] [address] [phone]  [facility]  [address] [place] [address] [phone]  [facility]  [address] [place] [address] [phone]    CSW will continue to monitor disposition.    Damita Dunnings, MSW, LCSW-A  10:10 AM 07/15/2021

## 2021-07-15 NOTE — ED Notes (Signed)
This Clinical research associate arrived to pt's bedside. Pt was teary-eyed. This Clinical research associate made small talk to pt. Pt then stated that she wanted to talk to her mother. Pt attempted to talk to her mother, phone went straight to voicemail. Pt then called her daughter, Autumn Estrada. Pt was told about the 5 minute phone call policy. Will continue to monitor pt.

## 2021-07-15 NOTE — ED Notes (Signed)
Pt was voicing high level of anxiety/depression and stated "I wish I could go out for a puff of vape".. This Clinical research associate told her if it's the taste that she desires, an ice cream is a great substitute but if it's the nicotine, we could provide a patch. Pt asked for an ice cream, pt eating her ice cream, mood has lighten up. Will continue to monitor pt

## 2021-07-15 NOTE — ED Provider Notes (Signed)
Emergency Medicine Observation Re-evaluation Note  Autumn Estrada is a 55 y.o. female, seen on rounds today.  Pt initially presented to the ED for complaints of depression and SI.  Currently alert, nad.   Physical Exam  BP 112/67 (BP Location: Left Arm)   Pulse 62   Temp 98 F (36.7 C) (Oral)   Resp 18   SpO2 95%  Physical Exam General: alert, content Cardiac: regular rate Lungs: breathing comfortably Psych: depressed mood.   ED Course / MDM   I have reviewed the labs performed to date as well as medications administered while in observation.  Recent changes in the last 24 hours include ED observation, med management, BH reassessment.   Plan  BH team indicates pt accepted to [facility].   VItals normal, nad, pt currently appears stable for transfer.        Cathren Laine, MD 07/15/21 [phone]

## 2021-07-15 NOTE — Progress Notes (Signed)
Pt accepted to [facility]     Patient meets inpatient criteria per Liborio Nixon, NP   Dr.Muhammad Roselle Locus is the attending provider.    Call report to [phone]  Luanne Bras, RN @ [facility] ED notified.     Pt scheduled  to arrive at [facility] today by 1800  Damita Dunnings, MSW, LCSW-A  10:32 AM 07/15/2021

## 2021-07-15 NOTE — Discharge Instructions (Addendum)
Transfer to [facility]
